# Patient Record
Sex: Female | Born: 1960
Health system: Southern US, Community
[De-identification: ages and names within clinical notes are randomized; demographics above are authoritative.]

## PROBLEM LIST (undated history)

## (undated) DIAGNOSIS — I10 Essential (primary) hypertension: Secondary | ICD-10-CM

## (undated) DIAGNOSIS — E669 Obesity, unspecified: Secondary | ICD-10-CM

## (undated) DIAGNOSIS — I480 Paroxysmal atrial fibrillation: Secondary | ICD-10-CM

## (undated) DIAGNOSIS — K219 Gastro-esophageal reflux disease without esophagitis: Secondary | ICD-10-CM

## (undated) DIAGNOSIS — I493 Ventricular premature depolarization: Secondary | ICD-10-CM

## (undated) DIAGNOSIS — S52501A Unspecified fracture of the lower end of right radius, initial encounter for closed fracture: Secondary | ICD-10-CM

## (undated) DIAGNOSIS — Z9289 Personal history of other medical treatment: Secondary | ICD-10-CM

## (undated) DIAGNOSIS — M1990 Unspecified osteoarthritis, unspecified site: Secondary | ICD-10-CM

## (undated) HISTORY — PX: BREAST EXCISIONAL BIOPSY: SUR124

## (undated) HISTORY — DX: Ventricular premature depolarization: I49.3

## (undated) HISTORY — DX: Gastro-esophageal reflux disease without esophagitis: K21.9

## (undated) HISTORY — DX: Paroxysmal atrial fibrillation: I48.0

## (undated) HISTORY — PX: ABDOMINAL HYSTERECTOMY: SHX81

## (undated) HISTORY — DX: Obesity, unspecified: E66.9

## (undated) HISTORY — DX: Personal history of other medical treatment: Z92.89

## (undated) HISTORY — PX: PATELLA FRACTURE SURGERY: SHX735

## (undated) HISTORY — DX: Essential (primary) hypertension: I10

---

## 1998-04-16 HISTORY — PX: BREAST LUMPECTOMY: SHX2

## 1998-04-19 ENCOUNTER — Ambulatory Visit (HOSPITAL_COMMUNITY): Admission: RE | Admit: 1998-04-19 | Discharge: 1998-04-19 | Payer: Self-pay | Admitting: Obstetrics & Gynecology

## 1998-04-27 ENCOUNTER — Ambulatory Visit (HOSPITAL_COMMUNITY): Admission: RE | Admit: 1998-04-27 | Discharge: 1998-04-27 | Payer: Self-pay | Admitting: Obstetrics & Gynecology

## 1998-05-14 ENCOUNTER — Ambulatory Visit (HOSPITAL_COMMUNITY): Admission: RE | Admit: 1998-05-14 | Discharge: 1998-05-14 | Payer: Self-pay | Admitting: *Deleted

## 1998-07-17 HISTORY — PX: TONSILLECTOMY: SUR1361

## 1998-09-15 ENCOUNTER — Inpatient Hospital Stay (HOSPITAL_COMMUNITY): Admission: EM | Admit: 1998-09-15 | Discharge: 1998-09-18 | Payer: Self-pay | Admitting: *Deleted

## 1999-02-28 ENCOUNTER — Other Ambulatory Visit: Admission: RE | Admit: 1999-02-28 | Discharge: 1999-02-28 | Payer: Self-pay | Admitting: Obstetrics & Gynecology

## 1999-09-13 ENCOUNTER — Encounter: Payer: Self-pay | Admitting: Obstetrics & Gynecology

## 1999-09-13 ENCOUNTER — Ambulatory Visit (HOSPITAL_COMMUNITY): Admission: RE | Admit: 1999-09-13 | Discharge: 1999-09-13 | Payer: Self-pay | Admitting: Obstetrics & Gynecology

## 2000-04-11 ENCOUNTER — Other Ambulatory Visit: Admission: RE | Admit: 2000-04-11 | Discharge: 2000-04-11 | Payer: Self-pay | Admitting: Obstetrics & Gynecology

## 2001-03-13 ENCOUNTER — Encounter: Admission: RE | Admit: 2001-03-13 | Discharge: 2001-03-13 | Payer: Self-pay | Admitting: Obstetrics & Gynecology

## 2001-03-13 ENCOUNTER — Encounter: Payer: Self-pay | Admitting: Obstetrics & Gynecology

## 2001-04-15 ENCOUNTER — Other Ambulatory Visit: Admission: RE | Admit: 2001-04-15 | Discharge: 2001-04-15 | Payer: Self-pay | Admitting: Obstetrics & Gynecology

## 2001-12-09 ENCOUNTER — Encounter: Payer: Self-pay | Admitting: Urology

## 2001-12-09 ENCOUNTER — Ambulatory Visit (HOSPITAL_COMMUNITY): Admission: RE | Admit: 2001-12-09 | Discharge: 2001-12-09 | Payer: Self-pay | Admitting: Urology

## 2002-06-10 ENCOUNTER — Other Ambulatory Visit: Admission: RE | Admit: 2002-06-10 | Discharge: 2002-06-10 | Payer: Self-pay | Admitting: Obstetrics & Gynecology

## 2003-03-27 ENCOUNTER — Ambulatory Visit (HOSPITAL_COMMUNITY): Admission: RE | Admit: 2003-03-27 | Discharge: 2003-03-27 | Payer: Self-pay | Admitting: Internal Medicine

## 2003-03-27 ENCOUNTER — Encounter: Payer: Self-pay | Admitting: Internal Medicine

## 2003-06-29 ENCOUNTER — Other Ambulatory Visit: Admission: RE | Admit: 2003-06-29 | Discharge: 2003-06-29 | Payer: Self-pay | Admitting: Obstetrics & Gynecology

## 2003-07-03 ENCOUNTER — Encounter: Admission: RE | Admit: 2003-07-03 | Discharge: 2003-07-03 | Payer: Self-pay | Admitting: Obstetrics & Gynecology

## 2004-04-11 ENCOUNTER — Ambulatory Visit (HOSPITAL_COMMUNITY): Admission: RE | Admit: 2004-04-11 | Discharge: 2004-04-11 | Payer: Self-pay | Admitting: Family Medicine

## 2004-05-02 ENCOUNTER — Ambulatory Visit (HOSPITAL_COMMUNITY): Admission: RE | Admit: 2004-05-02 | Discharge: 2004-05-02 | Payer: Self-pay | Admitting: Family Medicine

## 2004-08-12 ENCOUNTER — Other Ambulatory Visit: Admission: RE | Admit: 2004-08-12 | Discharge: 2004-08-12 | Payer: Self-pay | Admitting: Obstetrics & Gynecology

## 2005-06-03 ENCOUNTER — Ambulatory Visit (HOSPITAL_COMMUNITY): Admission: RE | Admit: 2005-06-03 | Discharge: 2005-06-03 | Payer: Self-pay | Admitting: Family Medicine

## 2005-11-08 ENCOUNTER — Ambulatory Visit (HOSPITAL_COMMUNITY): Admission: RE | Admit: 2005-11-08 | Discharge: 2005-11-08 | Payer: Self-pay | Admitting: Family Medicine

## 2005-11-13 ENCOUNTER — Ambulatory Visit (HOSPITAL_COMMUNITY): Admission: RE | Admit: 2005-11-13 | Discharge: 2005-11-14 | Payer: Self-pay | Admitting: Family Medicine

## 2005-11-15 ENCOUNTER — Encounter: Payer: Self-pay | Admitting: Obstetrics & Gynecology

## 2005-12-14 ENCOUNTER — Ambulatory Visit (HOSPITAL_COMMUNITY): Admission: RE | Admit: 2005-12-14 | Discharge: 2005-12-14 | Payer: Self-pay | Admitting: Cardiology

## 2007-03-22 ENCOUNTER — Ambulatory Visit (HOSPITAL_COMMUNITY): Admission: RE | Admit: 2007-03-22 | Discharge: 2007-03-22 | Payer: Self-pay | Admitting: Family Medicine

## 2007-04-01 ENCOUNTER — Encounter (HOSPITAL_COMMUNITY): Admission: RE | Admit: 2007-04-01 | Discharge: 2007-04-16 | Payer: Self-pay | Admitting: Family Medicine

## 2007-10-04 ENCOUNTER — Ambulatory Visit (HOSPITAL_COMMUNITY): Admission: RE | Admit: 2007-10-04 | Discharge: 2007-10-04 | Payer: Self-pay | Admitting: Obstetrics & Gynecology

## 2008-06-23 ENCOUNTER — Encounter: Admission: RE | Admit: 2008-06-23 | Discharge: 2008-06-23 | Payer: Self-pay | Admitting: Orthopedic Surgery

## 2008-06-29 DIAGNOSIS — Z9289 Personal history of other medical treatment: Secondary | ICD-10-CM

## 2008-06-29 HISTORY — DX: Personal history of other medical treatment: Z92.89

## 2008-07-07 ENCOUNTER — Ambulatory Visit (HOSPITAL_BASED_OUTPATIENT_CLINIC_OR_DEPARTMENT_OTHER): Admission: RE | Admit: 2008-07-07 | Discharge: 2008-07-08 | Payer: Self-pay | Admitting: Orthopedic Surgery

## 2008-11-02 ENCOUNTER — Ambulatory Visit (HOSPITAL_COMMUNITY): Admission: RE | Admit: 2008-11-02 | Discharge: 2008-11-02 | Payer: Self-pay | Admitting: Obstetrics & Gynecology

## 2009-09-10 ENCOUNTER — Encounter: Admission: RE | Admit: 2009-09-10 | Discharge: 2009-09-10 | Payer: Self-pay | Admitting: Obstetrics & Gynecology

## 2009-09-24 ENCOUNTER — Encounter: Admission: RE | Admit: 2009-09-24 | Discharge: 2009-09-24 | Payer: Self-pay | Admitting: Obstetrics & Gynecology

## 2009-10-21 ENCOUNTER — Ambulatory Visit (HOSPITAL_BASED_OUTPATIENT_CLINIC_OR_DEPARTMENT_OTHER): Admission: RE | Admit: 2009-10-21 | Discharge: 2009-10-21 | Payer: Self-pay | Admitting: General Surgery

## 2010-08-05 ENCOUNTER — Ambulatory Visit: Admission: RE | Admit: 2010-08-05 | Payer: Self-pay | Source: Home / Self Care | Admitting: Urology

## 2010-08-06 ENCOUNTER — Encounter: Payer: Self-pay | Admitting: Obstetrics & Gynecology

## 2010-08-07 ENCOUNTER — Encounter: Payer: Self-pay | Admitting: Obstetrics & Gynecology

## 2010-10-05 LAB — POCT HEMOGLOBIN-HEMACUE: Hemoglobin: 14.1 g/dL (ref 12.0–15.0)

## 2010-10-05 LAB — BASIC METABOLIC PANEL
BUN: 11 mg/dL (ref 6–23)
CO2: 28 mEq/L (ref 19–32)
Calcium: 9.2 mg/dL (ref 8.4–10.5)
Chloride: 103 mEq/L (ref 96–112)
Creatinine, Ser: 0.49 mg/dL (ref 0.4–1.2)
GFR calc Af Amer: >60 mL/min (ref >60)
GFR calc non Af Amer: >60 mL/min (ref >60)
Glucose, Bld: 82 mg/dL (ref 70–99)
Potassium: 4.1 mEq/L (ref 3.5–5.1)
Sodium: 137 mEq/L (ref 135–145)
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
## 2011-02-06 ENCOUNTER — Ambulatory Visit (HOSPITAL_COMMUNITY)
Admission: RE | Admit: 2011-02-06 | Discharge: 2011-02-06 | Disposition: A | Discharge status: Home / Self Care | Payer: BC Managed Care – PPO | Source: Ambulatory Visit | Attending: Physical Medicine and Rehabilitation | Admitting: Physical Medicine and Rehabilitation

## 2011-02-06 ENCOUNTER — Other Ambulatory Visit (HOSPITAL_COMMUNITY): Payer: Self-pay | Admitting: Physical Medicine and Rehabilitation

## 2011-02-06 DIAGNOSIS — M79609 Pain in unspecified limb: Secondary | ICD-10-CM

## 2011-02-06 DIAGNOSIS — G894 Chronic pain syndrome: Secondary | ICD-10-CM

## 2011-02-06 DIAGNOSIS — M47814 Spondylosis without myelopathy or radiculopathy, thoracic region: Secondary | ICD-10-CM

## 2011-02-06 DIAGNOSIS — M546 Pain in thoracic spine: Secondary | ICD-10-CM

## 2011-02-06 DIAGNOSIS — M542 Cervicalgia: Secondary | ICD-10-CM

## 2011-04-21 LAB — BASIC METABOLIC PANEL
BUN: 13 mg/dL (ref 6–23)
CO2: 27 mEq/L (ref 19–32)
Calcium: 9.4 mg/dL (ref 8.4–10.5)
Chloride: 102 mEq/L (ref 96–112)
Creatinine, Ser: 0.47 mg/dL (ref 0.4–1.2)
GFR calc Af Amer: >60 mL/min (ref >60)
GFR calc non Af Amer: >60 mL/min (ref >60)
Glucose, Bld: 120 mg/dL — ABNORMAL HIGH (ref 70–99)
Potassium: 3.5 mEq/L (ref 3.5–5.1)
Sodium: 137 mEq/L (ref 135–145)

## 2011-04-21 LAB — POCT HEMOGLOBIN-HEMACUE: Hemoglobin: 15.2 g/dL — ABNORMAL HIGH (ref 12.0–15.0)

## 2011-10-04 ENCOUNTER — Ambulatory Visit (HOSPITAL_COMMUNITY): Payer: BC Managed Care – PPO | Admitting: Physical Therapy

## 2011-10-10 ENCOUNTER — Ambulatory Visit (HOSPITAL_COMMUNITY)
Admission: RE | Admit: 2011-10-10 | Discharge: 2011-10-10 | Disposition: A | Discharge status: Home / Self Care | Payer: BC Managed Care – PPO | Source: Ambulatory Visit | Attending: Orthopedic Surgery | Admitting: Orthopedic Surgery

## 2011-10-10 DIAGNOSIS — M6281 Muscle weakness (generalized): Secondary | ICD-10-CM | POA: Insufficient documentation

## 2011-10-10 DIAGNOSIS — M25519 Pain in unspecified shoulder: Secondary | ICD-10-CM | POA: Insufficient documentation

## 2011-10-10 DIAGNOSIS — M7989 Other specified soft tissue disorders: Secondary | ICD-10-CM | POA: Insufficient documentation

## 2011-10-10 DIAGNOSIS — M546 Pain in thoracic spine: Secondary | ICD-10-CM | POA: Insufficient documentation

## 2011-10-10 DIAGNOSIS — IMO0001 Reserved for inherently not codable concepts without codable children: Secondary | ICD-10-CM | POA: Insufficient documentation

## 2011-10-10 NOTE — Evaluation (Signed)
Physical Therapy Evaluation  Patient Details  Name: Penny Byrd MRN: [id number] Date of Birth: 1961/06/18  Today's Date: 10/10/2011 Time: 1478-2956 Time Calculation (min): 58 min  Visit#: 1  of 12   Re-eval: 11/09/11 Assessment Diagnosis: cervical DDD Next MD Visit: 11/01/11 Prior Therapy: it has been years since she has had therapy  Past Medical History: No past medical history on file. Past Surgical History: No past surgical history on file.  Subjective Symptoms/Limitations Symptoms: Penny Byrd states that she has been having pain in her right shoulder blade area for years.   The patient states that the pain occasionally goes into her shoulder area but never down her arm. She denies neck pain.   She has had therpy before but it had been a long time ago, numerous cortisone shots, pain patches  without significant results.  Her MD would like her to have a MRI but the pt would like  to try therapy first.  Pt pain increases doing laundry, mopping and vacuuming.  The patient has increase pain with any lifting.  Lifting a jug of milk would incease her pain she states lifting a coffee cup would not.   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 Leisure: Hobbies-yes (Comment)  Cognition/Observation  The patient has increased swelling lateral to paraspinal mm along   approximate T4-T7 area.    Assessment Cervical AROM Cervical Flexion: wnl with not increase pain Cervical Extension:  (wnl) Cervical - Right Side Bend: wnl Cervical - Left Side Bend: wnl Cervical - Right Rotation: decrease 20 % Cervical - Left Rotation: wnl Cervical Strength Cervical Extension: 5/5 Cervical - Right Side Bend: 5/5 Cervical - Left Side Bend: 5/5 L UE: AROM:  WFL Strength:   WFL except ER which is 3+/5 B Palpation Palpation:  (Pt has significant tenderness to palpation area of swelling   Exercises:   Seated Retraction: 10 reps External Rotation: 10 reps Other Seated Exercises: shoulder shrugs up/back relax. Prone  Retraction: Both;10 reps Extension: Strengthening;Both;10 reps  Modalities Modalities: Cryotherapy;Electrical Stimulation Cryotherapy Number Minutes Cryotherapy: 10 Minutes Cryotherapy Location: Back Electrical Stimulation Electrical Stimulation Location: R thoracic aree lateral to T4-T8 Electrical Stimulation Action: IFES; Hi-Lo sweep  Electrical Stimulation Parameters: 16 Electrical Stimulation Goals: Edema;Pain  Physical Therapy Assessment and Plan PT Assessment and Plan Clinical Impression Statement: Pt with pain,swelling and mm weakness causing decreased ability to complete normal ADL's who will benefit from skilled physical therapy to return pt to being painfree Rehab Potential: Good Clinical Impairments Affecting Rehab Potential: pain, weakness PT Frequency: Min 3X/week PT Duration: 4 weeks PT Treatment/Interventions: Therapeutic activities;Therapeutic exercise;Patient/family education (modalities as needed for pain and edema control) PT Plan: Begin T-band for ER and ADDudcution of R shoulder as well as L SB    Goals Home Exercise Program Pt will Perform Home Exercise Program: Independently PT Short Term Goals Time to Complete Short Term Goals: 2 weeks PT Short Term Goal 1: Pt pain to be no greater than a 5 during ADL's including cleaning house. PT Long Term Goals Time to Complete Long Term Goals: 4 weeks PT Long Term Goal 1: Pt pain to be no greater than a 2 during housecleaning and lifting. PT Long Term Goal 2: Pt to be able to complete laundry with pain being at a 2 or below. Long  Term Goal 3: I in advance HEP Long Term Goal 4: Pt to be able to verbalize the importance of good posture in preventing back care.  Problem  List Patient Active Problem List  Diagnoses  . Swelling of skeletal muscle  . Pain in thoracic spine    PT - End of Session Activity Tolerance: Patient tolerated treatment well General Behavior During Session: [facility] for tasks performed Cognition: [facility] for tasks performed PT Plan of Care Consulted and Agree with Plan of Care: Patient    Joshaua Epple,CINDY 10/10/2011, 7:53 PM  Physician Documentation Your signature is required to indicate approval of the treatment plan as stated above.  Please sign and either send electronically or make a copy of this report for your files and return this physician signed original.   Please mark one 1.__approve of plan  2. ___approve of plan with the following conditions.   ______________________________                                                          _____________________ Physician Signature                                                                                                             Date

## 2011-10-10 NOTE — Patient Instructions (Addendum)
HEP given 

## 2011-10-11 ENCOUNTER — Ambulatory Visit (HOSPITAL_COMMUNITY)
Admission: RE | Admit: 2011-10-11 | Discharge: 2011-10-11 | Disposition: A | Discharge status: Home / Self Care | Payer: BC Managed Care – PPO | Source: Ambulatory Visit | Attending: Pediatrics | Admitting: Pediatrics

## 2011-10-11 DIAGNOSIS — M546 Pain in thoracic spine: Secondary | ICD-10-CM

## 2011-10-11 DIAGNOSIS — M7989 Other specified soft tissue disorders: Secondary | ICD-10-CM

## 2011-10-11 NOTE — Progress Notes (Signed)
Physical Therapy Treatment Patient Details  Name: Penny Byrd MRN: [id number] Date of Birth: 03/26/1961  Today's Date: 10/11/2011 Time: 2952-8413 Time Calculation (min): 43 min Visit#: 2  of 12   Re-eval: 11/09/11  Charge: therex 33 min IFES 10 min Ice 10 min  Subjective: Symptoms/Limitations Symptoms: Pt stated no pain at entrance, pt able to demonstrate HEP exercises correctly with no cueing required. Pain Assessment Currently in Pain?: No/denies  Objective:  Exercise/Treatments Seated Retraction: Limitations;10 reps Retraction Limitations: cervical and scapular retraction 10x 5" holds External Rotation: 10 reps Other Seated Exercises: shoulder shrugs up/back relax. Other Seated Exercises: cervical rotation and SB 10 x 10" each Prone  Retraction: Both;Limitations;15 reps Retraction Limitations: row Extension: 15 reps;Both Other Prone Exercises: wback 10 reps Standing External Rotation: Right;Theraband;15 reps Theraband Level (Shoulder External Rotation): Level 3 (Green) Other Standing Exercises: Right adduction 15x 5" green tband ROM / Strengthening / Isometric Strengthening UBE (Upper Arm Bike): 4' backwards  Modalities Modalities: Cryotherapy;Electrical Stimulation Cryotherapy Number Minutes Cryotherapy: 10 Minutes Cryotherapy Location: Back Type of Cryotherapy: Ice pack Pharmacologist Location: R thoracic aree lateral to T4-T8  Electrical Stimulation Action: IFES; Hi-Lo sweep  Electrical Stimulation Parameters: 15 Electrical Stimulation Goals: Edema  Physical Therapy Assessment and Plan PT Assessment and Plan Clinical Impression Statement: Able to add new exercises per PT POC following demonstration and min cueing for proper technique/form without difficulty. PT Plan: Continue with current POC.    Goals    Problem List Patient Active Problem List  Diagnoses  . Swelling of skeletal muscle  . Pain in thoracic spine     PT - End of Session Activity Tolerance: Patient tolerated treatment well General Behavior During Session: [facility] for tasks performed Cognition: [facility] for tasks performed  GP No functional reporting required  Juel Burrow, PTA 10/11/2011, 6:24 PM

## 2011-10-12 ENCOUNTER — Ambulatory Visit (HOSPITAL_COMMUNITY)
Admission: RE | Admit: 2011-10-12 | Discharge: 2011-10-12 | Disposition: A | Discharge status: Home / Self Care | Payer: BC Managed Care – PPO | Source: Ambulatory Visit | Attending: Pediatrics | Admitting: Pediatrics

## 2011-10-12 NOTE — Progress Notes (Signed)
Physical Therapy Treatment Patient Details  Name: Penny Byrd MRN: [id number] Date of Birth: [birth year]  Today's Date: 10/12/2011 Time: 4098-1191 Time Calculation (min): 55 min Visit#: 3  of 12   Re-eval: 11/09/11 Charges;  therex 30', IFES with ice X 15'    Subjective: Symptoms: Pt. states the exercises makes it feel better but then she hurts worse afterward when she fatigues.  States doing household duties (i.e.laundry) really gets it hurting.  Exercise/Treatments Seated Retraction: 15 reps Retraction Limitations: cervical and scapular retraction 10x 5" holds Prone  Retraction: 15 reps Retraction Limitations: row Extension: 15 reps;Both Other Prone Exercises: wback 15 reps Other Prone Exercises: alternating UE's 5 reps Standing External Rotation: Right;Theraband;15 reps Theraband Level (Shoulder External Rotation): Level 3 (Green) Other Standing Exercises: Tband Adduction 15X5" green Other Standing Exercises: postural 3 (retraction, rows, ext) 10 reps each green tband ROM / Strengthening / Isometric Strengthening UBE (Upper Arm Bike): 4' backwards     Modalities Modalities: Cryotherapy;Electrical Stimulation Cryotherapy Number Minutes Cryotherapy: 15 Minutes Cryotherapy Location:  (thoracic/ R scapular area) Museum/gallery exhibitions officer Stimulation Location: R thoracic/scapular area lateral to T4-T8 Electrical Stimulation Action: IFES hi lo sweep Intensity 12; Pt. laying in prone position with ice along with estim Electrical Stimulation Goals: Pain  Physical Therapy Assessment and Plan PT Assessment and Plan Clinical Impression Statement: Added alternating UE's in prone with fatigue at 5 reps; Attempted some manual/Soft tissue in the area of pain but pt. too hypersensitive at this time; e-stim/ice seems to be working well for pt.  Pt. requires manual cues to perform tband exercises correctly. PT Plan: Continue to progress scapular strength/stab; Add Ball  up/down, R/L and circles with elbow extended on wall next visit.     Problem List Patient Active Problem List  Diagnoses  . Swelling of skeletal muscle  . Pain in thoracic spine    PT - End of Session Activity Tolerance: Patient tolerated treatment well General Behavior During Session: [facility] for tasks performed Cognition: [facility] for tasks performed   Balian Schaller B. Bascom Levels, PTA 10/12/2011, 3:50 PM

## 2011-10-17 ENCOUNTER — Ambulatory Visit (HOSPITAL_COMMUNITY)
Admission: RE | Admit: 2011-10-17 | Discharge: 2011-10-17 | Disposition: A | Discharge status: Home / Self Care | Payer: BC Managed Care – PPO | Source: Ambulatory Visit | Attending: Pediatrics | Admitting: Pediatrics

## 2011-10-17 DIAGNOSIS — M546 Pain in thoracic spine: Secondary | ICD-10-CM

## 2011-10-17 DIAGNOSIS — IMO0001 Reserved for inherently not codable concepts without codable children: Secondary | ICD-10-CM | POA: Insufficient documentation

## 2011-10-17 DIAGNOSIS — M7989 Other specified soft tissue disorders: Secondary | ICD-10-CM

## 2011-10-17 DIAGNOSIS — M6281 Muscle weakness (generalized): Secondary | ICD-10-CM | POA: Insufficient documentation

## 2011-10-17 DIAGNOSIS — M25519 Pain in unspecified shoulder: Secondary | ICD-10-CM | POA: Insufficient documentation

## 2011-10-17 NOTE — Progress Notes (Signed)
Physical Therapy Treatment Patient Details  Name: Penny Byrd MRN: [id number] Date of Birth: 05-31-1961  Today's Date: 10/17/2011 Time: 4098-1191 Time Calculation (min): 50 min Visit#: 4  of 12   Re-eval: 11/09/11  Charge: therex 30 min IFES/ice 10 min  Subjective: Symptoms/Limitations Symptoms: Pt states no real pain to speak of worse pain is following housecleaning activities, it doesnt hurt during but the swelling/pain comes following.   Pain Assessment Currently in Pain?: No/denies  Objective:   Exercise/Treatments  Standing External Rotation: Right;Theraband;15 reps Theraband Level (Shoulder External Rotation): Level 3 (Green) Other Standing Exercises: Tband Adduction 15X5" green; postural 3 (retraction, rows, ext) 15 reps each green tband Other Standing Exercises: green ball A/P, R/L, CW/ CCW x 1 min each UE ROM / Strengthening / Isometric Strengthening UBE (Upper Arm Bike): 4' backwards Over Head Lace: 2' 34 lacing to eye level and back up again    Physical Therapy Assessment and Plan PT Assessment and Plan Clinical Impression Statement: Began new exercises for UE endurance with min cueing to relax UT.  Pt concerned with insurance payments and has cancelled PT sessions the rest of the week, pt will return to MD on 10/25/2011 reviewed goals secondary, pt did report she will be back to PT session prior MD apt next session.   PT Plan: Re-eval next session prior MD apt.    Goals Home Exercise Program PT Goal: Perform Home Exercise Program - Progress: Met PT Short Term Goals PT Short Term Goal 1 - Progress: Not met PT Long Term Goals PT Long Term Goal 1 - Progress: Not met PT Long Term Goal 2 - Progress: Not met Long Term Goal 4 Progress: Progressing toward goal  Problem List Patient Active Problem List  Diagnoses  . Swelling of skeletal muscle  . Pain in thoracic spine    PT - End of Session Activity Tolerance: Patient tolerated treatment  well General Behavior During Session: [facility] for tasks performed Cognition: [facility] for tasks performed  GP No functional reporting required  Juel Burrow, PTA 10/17/2011, 6:17 PM

## 2011-10-19 ENCOUNTER — Ambulatory Visit (HOSPITAL_COMMUNITY): Payer: BC Managed Care – PPO

## 2011-10-23 ENCOUNTER — Ambulatory Visit (HOSPITAL_COMMUNITY)
Admission: RE | Admit: 2011-10-23 | Discharge: 2011-10-23 | Disposition: A | Discharge status: Home / Self Care | Payer: BC Managed Care – PPO | Source: Ambulatory Visit | Attending: Pediatrics | Admitting: Pediatrics

## 2011-10-23 ENCOUNTER — Ambulatory Visit (HOSPITAL_COMMUNITY): Payer: BC Managed Care – PPO | Admitting: Physical Therapy

## 2011-10-23 NOTE — Evaluation (Signed)
Physical Therapy RE-Evaluation  Patient Details  Name: Penny Byrd MRN: [id number] Date of Birth: [birth year]  Today's Date: 10/23/2011 Time: 0802-0847 Time Calculation (min): 45 min  Visit#: 5  of 5   Re-eval: 11/09/11 Assessment Diagnosis: cervical DDD Next MD Visit: 11/01/11 Prior Therapy: it has been years since she has had therapy  Past Medical History: No past medical history on file. Past Surgical History: No past surgical history on file.  Subjective Symptoms/Limitations Symptoms:  [name] states that the last time she left her it was awful.. She states that she has been doing her exercises at home. How long can you sit comfortably?: [name] states that she has not noticed that her housework is any easier. How long can you stand comfortably?: Lifting is still a problem. 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 Leisure: Hobbies-yes (Comment)  Assessment RLE AROM (degrees) Overall AROM Right Lower Extremity: Within functional limits for tasks assessed RLE Strength RLE Overall Strength: Within Functional Limits for tasks assessed except for ER which was 3+/5 B at evaluation; now 4- Cervical AROM Cervical Flexion: wnl with not increase pain Cervical Extension:  (wnl) Cervical - Right Side Bend: wnl Cervical - Left Side Bend: wnl Cervical - Right Rotation: wnl was decreased 20% Cervical - Left Rotation: wnl Cervical Strength Cervical Extension: 5/5 Cervical - Right Side Bend: 5/5 Cervical - Left Side Bend: 5/5 Palpation Palpation: Pt is still not as tender to palpation as she was at evaluation time.  Exercise/Treatments   Standing Other Standing Exercises: Tband ER/ADD and Posstural 3 x 10   ROM / Strengthening / Isometric Strengthening UBE (Upper Arm Bike): 4' backward    Prone ex:  Prone ext  with 1# wt; prone scapular retraction with 3# weight x 10 each.   Physical Therapy Assessment and Plan PT Assessment and Plan Clinical Impression Statement: Pt is concerned about insurance coverage and would like to continue to work on own at home.  Pt has shown some improvement but continues to have limitations.  Recommend HEP for two weeks if pt does not improve she may want to return to thearapy for  manual techniques or modalities.   Rehab Potential: Good PT Plan: discharge to HEP.    Goals Home Exercise Program Pt will Perform Home Exercise Program: Independently PT Short Term Goals PT Short Term Goal 1: [name] states that the highest her pain has been after cleaning her house has been a 6 was an 8/10. PT Short Term Goal 1 - Progress: Progressing toward goal PT Long Term Goals PT Long Term Goal 1 - Progress: Not met PT Long Term Goal 2: Pt does laundry with other housework so it is difficult to differentiate housework from laundry. Long Term Goal 3 Progress: Met Long Term Goal 4 Progress: Progressing toward goal  Problem List Patient Active Problem List  Diagnoses  . Swelling of skeletal muscle  . Pain in thoracic spine    PT - End of Session Activity Tolerance: Patient tolerated treatment well General Behavior During Session: [facility] for tasks performed Cognition: [facility] for tasks performed PT Plan of Care PT Home Exercise Plan: t-band and prone stabilization exercises were given to the patient to work on at home.    Arieliz Latino,CINDY 10/23/2011, 9:49 AM  Physician Documentation Your signature is required to indicate approval of the treatment plan as stated above.  Please sign and either send electronically or make a copy of this report for your files and return this physician signed original.   Please mark one 1.__approve of plan  2. ___approve of plan with the following conditions.   ______________________________                                                           _____________________ Physician Signature                                                                                                             Date

## 2011-10-25 ENCOUNTER — Ambulatory Visit (HOSPITAL_COMMUNITY): Payer: BC Managed Care – PPO | Admitting: Physical Therapy

## 2011-10-27 ENCOUNTER — Ambulatory Visit (HOSPITAL_COMMUNITY): Payer: BC Managed Care – PPO

## 2012-08-28 ENCOUNTER — Telehealth: Payer: Self-pay

## 2012-08-28 NOTE — Telephone Encounter (Signed)
Pt was referred by Dr. Dwana Melena for screening colonoscopy. LMOM to call.

## 2012-09-03 NOTE — Telephone Encounter (Signed)
LMOM to call.

## 2012-09-06 ENCOUNTER — Other Ambulatory Visit (HOSPITAL_COMMUNITY): Payer: Self-pay | Admitting: Internal Medicine

## 2012-09-06 ENCOUNTER — Ambulatory Visit (HOSPITAL_COMMUNITY)
Admission: RE | Admit: 2012-09-06 | Discharge: 2012-09-06 | Disposition: A | Discharge status: Home / Self Care | Payer: BC Managed Care – PPO | Source: Ambulatory Visit | Attending: Internal Medicine | Admitting: Internal Medicine

## 2012-09-06 DIAGNOSIS — N201 Calculus of ureter: Secondary | ICD-10-CM | POA: Insufficient documentation

## 2012-09-06 DIAGNOSIS — R1032 Left lower quadrant pain: Secondary | ICD-10-CM | POA: Insufficient documentation

## 2012-09-06 DIAGNOSIS — R3129 Other microscopic hematuria: Secondary | ICD-10-CM | POA: Insufficient documentation

## 2012-09-06 DIAGNOSIS — N200 Calculus of kidney: Secondary | ICD-10-CM

## 2012-09-10 NOTE — Telephone Encounter (Signed)
Letter to PCP

## 2012-12-14 ENCOUNTER — Encounter: Payer: Self-pay | Admitting: *Deleted

## 2012-12-23 ENCOUNTER — Encounter: Payer: Self-pay | Admitting: Cardiovascular Disease

## 2013-01-28 ENCOUNTER — Ambulatory Visit: Payer: BC Managed Care – PPO | Admitting: Cardiovascular Disease

## 2013-01-30 ENCOUNTER — Ambulatory Visit (INDEPENDENT_AMBULATORY_CARE_PROVIDER_SITE_OTHER): Payer: BC Managed Care – PPO | Admitting: Cardiovascular Disease

## 2013-01-30 ENCOUNTER — Encounter: Payer: Self-pay | Admitting: Cardiovascular Disease

## 2013-01-30 VITALS — BP 126/82 | HR 62 | Ht 61.0 in | Wt 179.1 lb

## 2013-01-30 DIAGNOSIS — E785 Hyperlipidemia, unspecified: Secondary | ICD-10-CM

## 2013-01-30 DIAGNOSIS — Q2112 Patent foramen ovale: Secondary | ICD-10-CM

## 2013-01-30 DIAGNOSIS — E782 Mixed hyperlipidemia: Secondary | ICD-10-CM

## 2013-01-30 DIAGNOSIS — I10 Essential (primary) hypertension: Secondary | ICD-10-CM

## 2013-01-30 DIAGNOSIS — I4891 Unspecified atrial fibrillation: Secondary | ICD-10-CM

## 2013-01-30 DIAGNOSIS — R002 Palpitations: Secondary | ICD-10-CM

## 2013-01-30 DIAGNOSIS — E66811 Obesity, class 1: Secondary | ICD-10-CM

## 2013-01-30 DIAGNOSIS — E669 Obesity, unspecified: Secondary | ICD-10-CM

## 2013-01-30 DIAGNOSIS — Q211 Atrial septal defect: Secondary | ICD-10-CM

## 2013-01-30 DIAGNOSIS — Q2111 Secundum atrial septal defect: Secondary | ICD-10-CM

## 2013-01-30 DIAGNOSIS — I480 Paroxysmal atrial fibrillation: Secondary | ICD-10-CM

## 2013-01-30 NOTE — Patient Instructions (Addendum)
Your physician recommends that you return for lab work fasting.  Your physician recommends that you schedule a follow-up appointment in 3 MONTHS.

## 2013-01-31 LAB — COMPREHENSIVE METABOLIC PANEL
ALT: 24 U/L (ref 0–35)
CO2: 27 mEq/L (ref 19–32)
Calcium: 9.6 mg/dL (ref 8.4–10.5)
Chloride: 106 mEq/L (ref 96–112)
Creat: 0.58 mg/dL (ref 0.50–1.10)
Glucose, Bld: 110 mg/dL — ABNORMAL HIGH (ref 70–99)
Sodium: 140 mEq/L (ref 135–145)
Total Protein: 6.7 g/dL (ref 6.0–8.3)

## 2013-01-31 LAB — CBC
Platelets: 353 10*3/uL (ref 150–400)
RBC: 4.55 MIL/uL (ref 3.87–5.11)
RDW: 13.8 % (ref 11.5–15.5)
WBC: 8.6 10*3/uL (ref 4.0–10.5)

## 2013-02-04 ENCOUNTER — Encounter: Payer: Self-pay | Admitting: Cardiovascular Disease

## 2013-02-04 ENCOUNTER — Encounter: Payer: Self-pay | Admitting: *Deleted

## 2013-02-04 DIAGNOSIS — E785 Hyperlipidemia, unspecified: Secondary | ICD-10-CM | POA: Insufficient documentation

## 2013-02-04 DIAGNOSIS — Q211 Atrial septal defect: Secondary | ICD-10-CM | POA: Insufficient documentation

## 2013-02-04 DIAGNOSIS — Q2112 Patent foramen ovale: Secondary | ICD-10-CM | POA: Insufficient documentation

## 2013-02-04 DIAGNOSIS — I10 Essential (primary) hypertension: Secondary | ICD-10-CM | POA: Insufficient documentation

## 2013-02-04 DIAGNOSIS — R002 Palpitations: Secondary | ICD-10-CM | POA: Insufficient documentation

## 2013-02-04 DIAGNOSIS — I480 Paroxysmal atrial fibrillation: Secondary | ICD-10-CM | POA: Insufficient documentation

## 2013-02-04 DIAGNOSIS — E669 Obesity, unspecified: Secondary | ICD-10-CM | POA: Insufficient documentation

## 2013-02-04 LAB — NMR LIPOPROFILE WITH LIPIDS
Cholesterol, Total: 179 mg/dL (ref <200)
HDL Particle Number: 27 umol/L — ABNORMAL LOW (ref >=30.5)
LDL (calc): 120 mg/dL — ABNORMAL HIGH (ref <100)
LDL Size: 20.4 nm — ABNORMAL LOW (ref >20.5)
LP-IR Score: 75 — ABNORMAL HIGH (ref <=45)
Large HDL-P: 1.5 umol/L — ABNORMAL LOW (ref >=4.8)
Small LDL Particle Number: 883 nmol/L — ABNORMAL HIGH (ref <=527)

## 2013-02-04 NOTE — Progress Notes (Signed)
Patient ID: Penny Byrd, female   DOB: 1960-08-05, 52 y.o.   MRN: [id number]     HPI: Penny Byrd, is a 52 y.o. female who presents for a 1 year cardiology evaluation. Penny Byrd has a history of paroxysmal atrial fibrillation. She has a documented very small unidirectional atrial septal defect/PFO. Additional problems have also included hypertension, palpitations, as well as obesity. In addition, she has been documented to have low HDL levels. The past year, she states her blood pressure has been controlled on her metoprolol and valsartan therapy. She did experience one episode of kidney stones. She has not been successful with weight loss. She does not routinely exercise. She is unaware of any recurrent atrial fibrillation. She presents for evaluation.    Past Medical History  Diagnosis Date  . Paroxysmal a-fib   . Hypertension   . Palpitation   . Morbid obesity   . H/O echocardiogram 09/16/2010    History of small atrioseptal defect/PFO that is unidirectional,(Right to Left)  . History of stress test 06/29/2008    Normal Myocardial perfusion imaging, No significant ischemia demonstrated this is a low risk scan, No prior study availiable for comparison  . Sleep apnea 01/23/2011    AHI during sleep (4h ) was 0.0/hr during REMM sleep 0.0/hr, RDI during sleep (4h49min) was 12.0/hr during REM sleep 1.7/hr  . Diabetes     Past Surgical History  Procedure Laterality Date  . Abdominal hysterectomy  age 28  . Breast lumpectomy Right 04/1998    Dr Precious Reel, Benign Right breast disease  . Tonsillectomy  2000    Not on File  Current Outpatient Prescriptions  Medication Sig Dispense Refill  . aspirin 325 MG tablet Take 325 mg by mouth daily.      . metoprolol succinate (TOPROL-XL) 50 MG 24 hr tablet Take 50 mg by mouth daily. Take with or immediately following a meal.      . omeprazole (PRILOSEC) 20 MG capsule Take 20 mg by mouth daily.      . valsartan (DIOVAN) 80 MG  tablet Take 80 mg by mouth daily.       No current facility-administered medications for this visit.    Socially she is married. There is no tobacco use. She does not routinely exercise.  ROS is negative for fevers, chills or night sweats. She is unaware of any recent palpitations on metoprolol. She denies chest pressure. She denies paresthesias. She denies wheezing. She does admit to some fatigue the she denies abdominal pain. She denies nausea vomiting or diarrhea. She denies change in bowel or bladder habits.  Other system review is negative.  PE BP 126/82  Pulse 62  Ht 5\' 1"  (1.549 m)  Wt 179 lb 1.6 oz (81.239 kg)  BMI 33.86 kg/m2  General: Alert, oriented, no distress.  Skin: normal turgor, no rashes HEENT: Normocephalic, atraumatic. Pupils round and reactive; sclera anicteric;no lid lag.  Nose without nasal septal hypertrophy Mouth/Parynx benign; Mallinpatti scale 3 Neck: No JVD, no carotid briuts Lungs: clear to ausculatation and percussion; no wheezing or rales Heart: RRR, s1 s2 normal Lungs 6 systolic murmur, unchanged.  Abdomen: soft, nontender; no hepatosplenomehaly, BS+; abdominal aorta nontender and not dilated by palpation. Pulses 2+ Extremities: no clubbing cyanosis or edema, Homan's sign negative  Neurologic: grossly nonfocal  ECG: Sinus rhythm at 62 beats per minute. PR interval 170 ms. QTc interval 410 ms  LABS:  BMET    Component Value Date/Time   NA 140 01/31/2013  0755   K 4.7 01/31/2013 0755   CL 106 01/31/2013 0755   CO2 27 01/31/2013 0755   GLUCOSE 110* 01/31/2013 0755   BUN 16 01/31/2013 0755   CREATININE 0.58 01/31/2013 0755   CREATININE 0.49 10/18/2009 1204   CALCIUM 9.6 01/31/2013 0755   GFRNONAA >60 10/18/2009 1204   GFRAA  Value: >60        The eGFR has been calculated using the MDRD equation. This calculation has not been validated in all clinical situations. eGFR's persistently <60 mL/min signify possible Chronic Kidney Disease. 10/18/2009 1204      Hepatic Function Panel     Component Value Date/Time   PROT 6.7 01/31/2013 0755   ALBUMIN 4.0 01/31/2013 0755   AST 19 01/31/2013 0755   ALT 24 01/31/2013 0755   ALKPHOS 80 01/31/2013 0755   BILITOT 0.3 01/31/2013 0755     CBC    Component Value Date/Time   WBC 8.6 01/31/2013 0755   RBC 4.55 01/31/2013 0755   HGB 13.9 01/31/2013 0755   HCT 39.6 01/31/2013 0755   PLT 353 01/31/2013 0755   MCV 87.0 01/31/2013 0755   MCH 30.5 01/31/2013 0755   MCHC 35.1 01/31/2013 0755   RDW 13.8 01/31/2013 0755     BNP No results found for this basename: probnp    Lipid Panel     Component Value Date/Time   TRIG 116 01/31/2013 0755   LDLCALC 120* 01/31/2013 0755     RADIOLOGY: No results found.    ASSESSMENT AND PLAN: Penny Byrd is a 52 year old female who has a history of hypertension, as well as remote history of paroxysmal atrial fibrillation. Her blood pressure is well controlled. She is unaware of any breakthrough palpitations or atrial fibrillation on current therapy. She is had low HDL levels in the past. I'm scheduling her for an MMR vital signs profile since I am suspect that she may very well have increased LDL particle number. We did discuss importance of exercise and weight loss the body mass index is 33.86. I will notify her regarding her laboratory and he will be adjusted accordingly. Of significant increased LDL particle number initiation of lipid therapy will be recommended.      Lennette Bihari, MD, [facility]  02/04/2013 9:03 AM

## 2013-02-10 ENCOUNTER — Other Ambulatory Visit: Payer: Self-pay | Admitting: *Deleted

## 2013-02-10 MED ORDER — ATORVASTATIN CALCIUM 20 MG PO TABS: 20.0000 mg | ORAL_TABLET | Freq: Every day | ORAL | Status: DC

## 2013-03-10 ENCOUNTER — Other Ambulatory Visit: Payer: Self-pay | Admitting: Cardiovascular Disease

## 2013-04-23 ENCOUNTER — Other Ambulatory Visit: Payer: Self-pay | Admitting: *Deleted

## 2013-04-23 MED ORDER — METOPROLOL SUCCINATE ER 50 MG PO TB24: 50.0000 mg | ORAL_TABLET | Freq: Every day | ORAL | Status: DC

## 2013-04-23 MED ORDER — DIOVAN 80 MG PO TABS: ORAL_TABLET | ORAL | Status: DC

## 2013-04-23 NOTE — Telephone Encounter (Signed)
Rx was sent to pharmacy electronically. 

## 2013-04-24 ENCOUNTER — Telehealth: Payer: Self-pay | Admitting: Cardiovascular Disease

## 2013-04-24 ENCOUNTER — Other Ambulatory Visit: Payer: Self-pay | Admitting: *Deleted

## 2013-04-24 NOTE — Telephone Encounter (Signed)
Patient is returning a call from [place] about her medications.

## 2013-04-24 NOTE — Telephone Encounter (Signed)
Forward to [employer]

## 2013-04-28 NOTE — Telephone Encounter (Signed)
LMTCB

## 2013-04-29 ENCOUNTER — Telehealth: Payer: Self-pay | Admitting: Cardiovascular Disease

## 2013-04-29 ENCOUNTER — Other Ambulatory Visit: Payer: Self-pay | Admitting: *Deleted

## 2013-04-29 NOTE — Telephone Encounter (Signed)
Called pharmacy Mardelle Matte) 04/29/13 AM to clarify.

## 2013-04-29 NOTE — Telephone Encounter (Signed)
Message forwarded to J. Elkins, RN.  

## 2013-04-29 NOTE — Telephone Encounter (Signed)
Please call-question about E Script received yesterday for her Metoprolol.

## 2014-04-29 ENCOUNTER — Other Ambulatory Visit: Payer: Self-pay | Admitting: Cardiovascular Disease

## 2014-04-29 NOTE — Telephone Encounter (Signed)
Rx was sent to pharmacy electronically. 

## 2014-05-04 ENCOUNTER — Ambulatory Visit (INDEPENDENT_AMBULATORY_CARE_PROVIDER_SITE_OTHER): Payer: BC Managed Care – PPO | Admitting: Cardiology

## 2014-05-04 ENCOUNTER — Encounter: Payer: Self-pay | Admitting: Cardiology

## 2014-05-04 VITALS — BP 148/86 | HR 62 | Ht 61.0 in | Wt 178.2 lb

## 2014-05-04 DIAGNOSIS — Q211 Atrial septal defect: Secondary | ICD-10-CM

## 2014-05-04 DIAGNOSIS — Z23 Encounter for immunization: Secondary | ICD-10-CM

## 2014-05-04 DIAGNOSIS — Q2112 Patent foramen ovale: Secondary | ICD-10-CM

## 2014-05-04 DIAGNOSIS — E669 Obesity, unspecified: Secondary | ICD-10-CM

## 2014-05-04 DIAGNOSIS — Z79899 Other long term (current) drug therapy: Secondary | ICD-10-CM

## 2014-05-04 DIAGNOSIS — I10 Essential (primary) hypertension: Secondary | ICD-10-CM

## 2014-05-04 DIAGNOSIS — I480 Paroxysmal atrial fibrillation: Secondary | ICD-10-CM

## 2014-05-04 DIAGNOSIS — E785 Hyperlipidemia, unspecified: Secondary | ICD-10-CM

## 2014-05-04 MED ORDER — METOPROLOL SUCCINATE ER 50 MG PO TB24: 50.0000 mg | ORAL_TABLET | Freq: Every day | ORAL | Status: DC

## 2014-05-04 MED ORDER — VALSARTAN 80 MG PO TABS: 80.0000 mg | ORAL_TABLET | Freq: Every day | ORAL | Status: DC

## 2014-05-04 NOTE — Patient Instructions (Signed)
Your medications have been refilled.   Please have fasting labwork in 1-2 weeks - you can go to the [facility] in [place] or Dr. Scharlene GlossHall's office (just have results sent to Dr. Tresa EndoKelly @ fax [phone])  Your physician wants you to follow-up in: 1 year with Dr. Tresa EndoKelly. You will receive a reminder letter in the mail two months in advance. If you don't receive a letter, please call our office to schedule the follow-up appointment.

## 2014-05-04 NOTE — Assessment & Plan Note (Signed)
No recurrence. 

## 2014-05-04 NOTE — Assessment & Plan Note (Signed)
Known elevated small particle.  Will recheck cholesterol and place on zetia once results are back.

## 2014-05-04 NOTE — Progress Notes (Addendum)
05/04/2014   PCP: Catalina PizzaHALL, ZACH, MD   Chief Complaint  Patient presents with  . Follow-up    1 year follow-up, pt denied SOB and chest pain    Primary Cardiologist:Dr. Bishop Limbo. Kelly   HPI:  53  y.o. female who presents for a 1 year cardiology evaluation.  Ms. Lou MinerHuffman has a history of paroxysmal atrial fibrillation. She has a documented very small unidirectional atrial septal defect/PFO. Additional problems have also included hypertension, palpitations, as well as obesity. In addition, she has been documented to have low HDL levels. The past year, she states her blood pressure has been controlled on her metoprolol and valsartan therapy. She did experience one episode of kidney stones. She has not been successful with weight loss. She does not routinely exercise. She is unaware of any recurrent atrial fibrillation. She presents for evaluation.  She has had a negative sleep study.  No complaints as above, recent BP check with PCP was stable. She stated it is always elevated here. She would like to have a flu shot as well.  Has not had any recent labs. Previously she was to take statin and if not statin then zetia.  Will check lipids.  She has known elevated small particle.     No Known Allergies  Current Outpatient Prescriptions  Medication Sig Dispense Refill  . aspirin 325 MG tablet Take 325 mg by mouth daily.      Marland Kitchen. DIOVAN 80 MG tablet Take 1 tablet (80 mg total) by mouth daily. MUST KEEP APPOINTMENT 05/04/14 WITH Ihsan Nomura FOR FUTURE REFILLS.  30 tablet  0  . estradiol (ESTRACE) 1 MG tablet Take 1 tablet by mouth daily.      . metoprolol succinate (TOPROL-XL) 50 MG 24 hr tablet Take 1 tablet (50 mg total) by mouth daily. Take with or immediately following a meal.  30 tablet  11  . omeprazole (PRILOSEC) 20 MG capsule Take 20 mg by mouth daily.      . valsartan (DIOVAN) 80 MG tablet Take 1 tablet (80 mg total) by mouth daily.  30 tablet  11   No current  facility-administered medications for this visit.    Past Medical History  Diagnosis Date  . Paroxysmal a-fib   . Hypertension   . Palpitation   . Morbid obesity   . H/O echocardiogram 09/16/2010    History of small atrioseptal defect/PFO that is unidirectional,(Right to Left)  . History of stress test 06/29/2008    Normal Myocardial perfusion imaging, No significant ischemia demonstrated this is a low risk scan, No prior study availiable for comparison  . Sleep apnea 01/23/2011    AHI during sleep (4h 41min) was 0.0/hr during REMM sleep 0.0/hr, RDI during sleep (4h1641min) was 12.0/hr during REM sleep 1.7/hr  . Diabetes     Past Surgical History  Procedure Laterality Date  . Abdominal hysterectomy  age 53  . Breast lumpectomy Right 04/1998    Dr Precious ReelAnita Lindsey, Benign Right breast disease  . Tonsillectomy  2000    NGE:XBMWUXL:KGROS:General:no colds or fevers, no weight changes Skin:no rashes or ulcers HEENT:no blurred vision, no congestion CV:see HPI PUL:see HPI GI:no diarrhea constipation or melena, no indigestion GU:no hematuria, no dysuria MS:no joint pain, no claudication Neuro:no syncope, no lightheadedness Endo:no diabetes, no thyroid disease  Wt Readings from Last 3 Encounters:  05/04/14 178 lb 3.2 oz (80.831 kg)  01/30/13 179 lb 1.6 oz (81.239 kg)    PHYSICAL EXAM  BP 148/86  Pulse 62  Ht 5\' 1"  (1.549 m)  Wt 178 lb 3.2 oz (80.831 kg)  BMI 33.69 kg/m2 General:Pleasant affect, NAD Skin:Warm and dry, brisk capillary refill HEENT:normocephalic, sclera clear, mucus membranes moist Neck:supple, no JVD, no bruits  Heart:S1S2 RRR without murmur, gallup, rub or click Lungs:clear without rales, rhonchi, or wheezes WUJ:WJXBAbd:soft, non tender, + BS, do not palpate liver spleen or masses Ext:no lower ext edema, 2+ pedal pulses, 2+ radial pulses Neuro:alert and oriented, MAE, follows commands, + facial symmetry  EKG:SR  Normal EKG  ASSESSMENT AND PLAN Essential hypertension I  refilled current meds.  She will check BP at home and if it stays elevated will let us know.  Hyperlipemia Known elevated small particle.  Will recheck cholesterol and place on zetia once results are back.  PFO (patent foramen ovale) stable  PAF (paroxysmal atrial fibrillation) No recurrence.   Obesity (BMI 30.0-34.9) Discussed importance of exercise, even if 10 min a day, her work day does include walking.   Follow up in 1 year with Dr. Bishop Limbo. Kelly unless problems prior to that time.

## 2014-05-04 NOTE — Assessment & Plan Note (Signed)
I refilled current meds.  She will check BP at home and if it stays elevated will let us know.

## 2014-05-04 NOTE — Assessment & Plan Note (Signed)
Discussed importance of exercise, even if 10 min a day, her work day does include walking.

## 2014-05-04 NOTE — Assessment & Plan Note (Signed)
stable °

## 2014-05-12 ENCOUNTER — Encounter: Payer: Self-pay | Admitting: Cardiology

## 2014-05-13 LAB — LIPID PANEL
CHOLESTEROL: 171 mg/dL (ref 0–200)
HDL: 49 mg/dL (ref >39)
LDL Cholesterol: 103 mg/dL — ABNORMAL HIGH (ref 0–99)
Total CHOL/HDL Ratio: 3.5 Ratio
Triglycerides: 95 mg/dL (ref <150)
VLDL: 19 mg/dL (ref 0–40)

## 2014-05-13 LAB — COMPREHENSIVE METABOLIC PANEL
ALBUMIN: 3.7 g/dL (ref 3.5–5.2)
ALT: 14 U/L (ref 0–35)
AST: 16 U/L (ref 0–37)
Alkaline Phosphatase: 69 U/L (ref 39–117)
BUN: 19 mg/dL (ref 6–23)
CO2: 26 meq/L (ref 19–32)
CREATININE: 0.53 mg/dL (ref 0.50–1.10)
Calcium: 9 mg/dL (ref 8.4–10.5)
Chloride: 103 mEq/L (ref 96–112)
Glucose, Bld: 91 mg/dL (ref 70–99)
POTASSIUM: 4.3 meq/L (ref 3.5–5.3)
SODIUM: 138 meq/L (ref 135–145)
TOTAL PROTEIN: 6.7 g/dL (ref 6.0–8.3)
Total Bilirubin: 0.4 mg/dL (ref 0.2–1.2)

## 2014-05-22 ENCOUNTER — Telehealth: Payer: Self-pay | Admitting: *Deleted

## 2014-05-22 NOTE — Telephone Encounter (Signed)
-----   Message from Leone BrandLaura R Ingold, NP sent at 05/19/2014  8:40 AM EST ----- Labs improved, would like to add zetia 10 mg daily and recheck lipids and hepatic in 6 months- thanks.

## 2014-05-22 NOTE — Telephone Encounter (Signed)
Left message for patient to return a call to me. 

## 2014-08-04 ENCOUNTER — Other Ambulatory Visit: Payer: Self-pay | Admitting: Obstetrics and Gynecology

## 2014-08-05 LAB — CYTOLOGY - PAP

## 2015-05-05 ENCOUNTER — Ambulatory Visit: Payer: Self-pay | Admitting: Physician Assistant

## 2015-05-06 ENCOUNTER — Ambulatory Visit (INDEPENDENT_AMBULATORY_CARE_PROVIDER_SITE_OTHER): Payer: 59 | Admitting: Adult Health

## 2015-05-06 ENCOUNTER — Encounter: Payer: Self-pay | Admitting: Adult Health

## 2015-05-06 VITALS — BP 126/76 | HR 53 | Ht 61.0 in | Wt 176.6 lb

## 2015-05-06 DIAGNOSIS — R002 Palpitations: Secondary | ICD-10-CM

## 2015-05-06 DIAGNOSIS — E785 Hyperlipidemia, unspecified: Secondary | ICD-10-CM

## 2015-05-06 DIAGNOSIS — Z23 Encounter for immunization: Secondary | ICD-10-CM

## 2015-05-06 DIAGNOSIS — I480 Paroxysmal atrial fibrillation: Secondary | ICD-10-CM

## 2015-05-06 DIAGNOSIS — I10 Essential (primary) hypertension: Secondary | ICD-10-CM

## 2015-05-06 MED ORDER — METOPROLOL SUCCINATE ER 50 MG PO TB24: 50.0000 mg | ORAL_TABLET | ORAL | Status: DC

## 2015-05-06 MED ORDER — ASPIRIN EC 325 MG PO TBEC: 325.0000 mg | DELAYED_RELEASE_TABLET | Freq: Every day | ORAL | Status: DC

## 2015-05-06 NOTE — Progress Notes (Deleted)
Name: Penny Byrd    DOB: 12-09-1960  Age: 54 y.o.  MR#: [id number]       PCP:  Wende Neighbors, MD      Insurance: Payor: Onnie Boer / Plan: [employer] OTHER / Product Type: *No Product type* /   CC:   No chief complaint on file.   VS Filed Vitals:   05/06/15 1301  BP: 126/76  Pulse: 53  Height: _0  (1.549 m)  Weight: 176 lb 9.6 oz (80.105 kg)  SpO2: 99%    Weights Current Weight  05/06/15 176 lb 9.6 oz (80.105 kg)  05/04/14 178 lb 3.2 oz (80.831 kg)  01/30/13 179 lb 1.6 oz (81.239 kg)    Blood Pressure  BP Readings from Last 3 Encounters:  05/06/15 126/76  05/04/14 148/86  01/30/13 126/82     Admit date:  (Not on file) Last encounter with RMR:  Visit date not found   Allergy Review of patient's allergies indicates no known allergies.  Current Outpatient Prescriptions  Medication Sig Dispense Refill  . aspirin 325 MG tablet Take 325 mg by mouth daily.    Marland Kitchen estradiol (ESTRACE) 1 MG tablet Take 1 tablet by mouth daily.    . metoprolol succinate (TOPROL-XL) 50 MG 24 hr tablet Take 1 tablet (50 mg total) by mouth daily. Take with or immediately following a meal. 30 tablet 11  . omeprazole (PRILOSEC) 20 MG capsule Take 20 mg by mouth daily.    . valsartan (DIOVAN) 80 MG tablet Take 1 tablet (80 mg total) by mouth daily. 30 tablet 11   No current facility-administered medications for this visit.    Discontinued Meds:    Medications Discontinued During This Encounter  Medication Reason  . DIOVAN 80 MG tablet Error    Patient Active Problem List   Diagnosis Date Noted  . Obesity (BMI 30.0-34.9) 02/04/2013  . Hyperlipemia 02/04/2013  . Essential hypertension 02/04/2013  . PFO (patent foramen ovale) 02/04/2013  . Palpitations 02/04/2013  . PAF (paroxysmal atrial fibrillation) ([place]) 02/04/2013  . Swelling of skeletal muscle 10/10/2011  . Pain in thoracic spine 10/10/2011    LABS    Component Value Date/Time   NA 138 05/13/2014 0952   NA 140  01/31/2013 0755   NA 137 10/18/2009 1204   K 4.3 05/13/2014 0952   K 4.7 01/31/2013 0755   K 4.1 10/18/2009 1204   CL 103 05/13/2014 0952   CL 106 01/31/2013 0755   CL 103 10/18/2009 1204   CO2 26 05/13/2014 0952   CO2 27 01/31/2013 0755   CO2 28 10/18/2009 1204   GLUCOSE 91 05/13/2014 0952   GLUCOSE 110* 01/31/2013 0755   GLUCOSE 82 10/18/2009 1204   BUN 19 05/13/2014 0952   BUN 16 01/31/2013 0755   BUN 11 10/18/2009 1204   CREATININE 0.53 05/13/2014 0952   CREATININE 0.58 01/31/2013 0755   CREATININE 0.49 10/18/2009 1204   CREATININE 0.47 07/06/2008 1400   CALCIUM 9.0 05/13/2014 0952   CALCIUM 9.6 01/31/2013 0755   CALCIUM 9.2 10/18/2009 1204   GFRNONAA >60 10/18/2009 1204   GFRNONAA >60 07/06/2008 1400   GFRAA  10/18/2009 1204    >60        The eGFR has been calculated using the MDRD equation. This calculation has not been validated in all clinical situations. eGFR's persistently <60 mL/min signify possible Chronic Kidney Disease.   GFRAA  07/06/2008 1400    >60        The  eGFR has been calculated using the MDRD equation. This calculation has not been validated in all clinical   CMP     Component Value Date/Time   NA 138 05/13/2014 0952   K 4.3 05/13/2014 0952   CL 103 05/13/2014 0952   CO2 26 05/13/2014 0952   GLUCOSE 91 05/13/2014 0952   BUN 19 05/13/2014 0952   CREATININE 0.53 05/13/2014 0952   CREATININE 0.49 10/18/2009 1204   CALCIUM 9.0 05/13/2014 0952   PROT 6.7 05/13/2014 0952   ALBUMIN 3.7 05/13/2014 0952   AST 16 05/13/2014 0952   ALT 14 05/13/2014 0952   ALKPHOS 69 05/13/2014 0952   BILITOT 0.4 05/13/2014 0952   GFRNONAA >60 10/18/2009 1204   GFRAA  10/18/2009 1204    >60        The eGFR has been calculated using the MDRD equation. This calculation has not been validated in all clinical situations. eGFR's persistently <60 mL/min signify possible Chronic Kidney Disease.       Component Value Date/Time   WBC 8.6 01/31/2013 0755    HGB 13.9 01/31/2013 0755   HGB 14.1 10/21/2009 1309   HGB 15.2* 07/07/2008 1040   HCT 39.6 01/31/2013 0755   MCV 87.0 01/31/2013 0755    Lipid Panel     Component Value Date/Time   CHOL 171 05/13/2014 0952   CHOL 179 01/31/2013 0755   TRIG 95 05/13/2014 0952   TRIG 116 01/31/2013 0755   HDL 49 05/13/2014 0952   HDL 36* 01/31/2013 0755   CHOLHDL 3.5 05/13/2014 0952   VLDL 19 05/13/2014 0952   LDLCALC 103* 05/13/2014 0952   LDLCALC 120* 01/31/2013 0755    ABG No results found for: PHART, PCO2ART, PO2ART, HCO3, TCO2, ACIDBASEDEF, O2SAT   Lab Results  Component Value Date   TSH 0.579 01/31/2013   BNP (last 3 results) No results for input(s): BNP in the last 8760 hours.  ProBNP (last 3 results) No results for input(s): PROBNP in the last 8760 hours.  Cardiac Panel (last 3 results) No results for input(s): CKTOTAL, CKMB, TROPONINI, RELINDX in the last 72 hours.  Iron/TIBC/Ferritin/ %Sat No results found for: IRON, TIBC, FERRITIN, IRONPCTSAT   EKG Orders placed or performed in visit on 05/06/15  . EKG 12-Lead     Prior Assessment and Plan Problem List as of 05/06/2015      Cardiovascular and Mediastinum   Essential hypertension   Last Assessment & Plan 05/04/2014 Office Visit Written 05/04/2014 12:21 PM by Isaiah Serge, NP    I refilled current meds.  She will check BP at home and if it stays elevated will let us know.      PFO (patent foramen ovale)   Last Assessment & Plan 05/04/2014 Office Visit Written 05/04/2014 12:22 PM by Isaiah Serge, NP    stable      PAF (paroxysmal atrial fibrillation) [facility])   Last Assessment & Plan 05/04/2014 Office Visit Written 05/04/2014 12:22 PM by Isaiah Serge, NP    No recurrence.         Other   Swelling of skeletal muscle   Pain in thoracic spine   Obesity (BMI 30.0-34.9)   Last Assessment & Plan 05/04/2014 Office Visit Written 05/04/2014 12:23 PM by Isaiah Serge, NP    Discussed importance of exercise, even  if 10 min a day, her work day does include walking.      Hyperlipemia   Last Assessment & Plan 05/04/2014 Office Visit Written  05/04/2014 12:21 PM by Isaiah Serge, NP    Known elevated small particle.  Will recheck cholesterol and place on zetia once results are back.      Palpitations       Imaging: No results found.

## 2015-05-06 NOTE — Patient Instructions (Signed)
Your physician wants you to follow-up in: 1 Year.  You will receive a reminder letter in the mail two months in advance. If you don't receive a letter, please call our office to schedule the follow-up appointment.  Your physician recommends that you continue on your current medications as directed. Please refer to the Current Medication list given to you today.  If you need a refill on your cardiac medications before your next appointment, please call your pharmacy.  Thank you for choosing [facility]!   

## 2015-05-06 NOTE — Progress Notes (Signed)
Cardiology Office Note   Date:  05/06/2015   ID:  Penny Europeeresa F Zhao, DOB [birth year], MRN [id number]  PCP:  Dwana MelenaZack Hall, MD  Cardiologist: Richelle ItoKelly  Rondel Episcopo, NP   Chief Complaint  Patient presents with  . Atrial Fibrillation  . Hypertension      History of Present Illness: Penny Byrd is a 54 y.o. female who presents for ongoing assessment and management of PAF, hypertension, with hx of PFO and morbid obesity.  CHADS VASC Score of 1. She is not on anticoagulation. She is here for annual follow up. Was seen last in 04/2014 .   She comes today without cardiac complaints. She states that on rare occasions she feels her HR up, usually with stress. States it gets better when she calms down. She has been having some stomach burning and has been on non-coated ASA for >20 yrs. She is adherent to her medication regimen. She is also requesting a flu shot.     Past Medical History  Diagnosis Date  . Paroxysmal a-fib ([facility])   . Hypertension   . Palpitation   . Morbid obesity ([facility])   . H/O echocardiogram 09/16/2010    History of small atrioseptal defect/PFO that is unidirectional,(Right to Left)  . History of stress test 06/29/2008    Normal Myocardial perfusion imaging, No significant ischemia demonstrated this is a low risk scan, No prior study availiable for comparison  . Sleep apnea 01/23/2011    AHI during sleep (4h 41min) was 0.0/hr during REMM sleep 0.0/hr, RDI during sleep (4h8441min) was 12.0/hr during REM sleep 1.7/hr  . Diabetes [facility](HCC)     Past Surgical History  Procedure Laterality Date  . Abdominal hysterectomy  age 54  . Breast lumpectomy Right 04/1998    Dr Precious ReelAnita Lindsey, Benign Right breast disease  . Tonsillectomy  2000     Current Outpatient Prescriptions  Medication Sig Dispense Refill  . aspirin 325 MG tablet Take 325 mg by mouth daily.    Marland Kitchen. estradiol (ESTRACE) 1 MG tablet Take 1 tablet by mouth daily.    . metoprolol succinate (TOPROL-XL) 50 MG 24 hr  tablet Take 1 tablet (50 mg total) by mouth daily. Take with or immediately following a meal. 30 tablet 11  . omeprazole (PRILOSEC) 20 MG capsule Take 20 mg by mouth daily.    . valsartan (DIOVAN) 80 MG tablet Take 1 tablet (80 mg total) by mouth daily. 30 tablet 11   No current facility-administered medications for this visit.    Allergies:   Review of patient's allergies indicates no known allergies.    Social History:  The patient  reports that she has never smoked. She has never used smokeless tobacco. She reports that she does not drink alcohol.   Family History:  The patient's family history includes Cancer in her father; Healthy in her sister; Heart disease in her father; Heart failure in her mother; Hypertension in her brother.    ROS: All other systems are reviewed and negative. Unless otherwise mentioned in H&P    PHYSICAL EXAM: VS:  BP 126/76 mmHg  Pulse 53  Ht 5\' 1"  (1.549 m)  Wt 176 lb 9.6 oz (80.105 kg)  BMI 33.39 kg/m2  SpO2 99% , BMI Body mass index is 33.39 kg/(m^2). GEN: Well nourished, well developed, in no acute distress HEENT: normal Neck: no JVD, carotid bruits, or masses Cardiac: RRR; soft systolic murmur at the apex, rubs, or gallops,no edema  Respiratory:  clear to auscultation bilaterally, normal  work of breathing GI: soft, nontender, nondistended, + BS MS: no deformity or atrophy Skin: warm and dry, no rash Neuro:  Strength and sensation are intact Psych: euthymic mood, full affect   EKG:  Normal sinus rhythm rate of 64 bpm.   Recent Labs: 05/13/2014: ALT 14; BUN 19; Creat 0.53; Potassium 4.3; Sodium 138    Lipid Panel    Component Value Date/Time   CHOL 171 05/13/2014 0952   CHOL 179 01/31/2013 0755   TRIG 95 05/13/2014 0952   TRIG 116 01/31/2013 0755   HDL 49 05/13/2014 0952   HDL 36* 01/31/2013 0755   CHOLHDL 3.5 05/13/2014 0952   VLDL 19 05/13/2014 0952   LDLCALC 103* 05/13/2014 0952   LDLCALC 120* 01/31/2013 0755      Wt  Readings from Last 3 Encounters:  05/06/15 176 lb 9.6 oz (80.105 kg)  05/04/14 178 lb 3.2 oz (80.831 kg)  01/30/13 179 lb 1.6 oz (81.239 kg)      Other studies Reviewed: Additional studies/ records that were reviewed today include: None    ASSESSMENT AND PLAN:  1. Paroxysmal Atrial fib: I will refill her metoprolol. She states she sometimes has sluggishness in the afternoons. I have suggested she try taking this at night to see if this is helpful to reduce these symptoms. She will change to ECASA 325 mg daily from non-coated ASA. Check H-pylori  2. Hypertension.  BP is well controlled. I will refill losartan. I will check BMET, CBC, Lipids and LFTS for annual evaluation and send copies to Dr. Margo Aye.         Current medicines are reviewed at length with the patient today.    Labs/ tests ordered today include: BMET, Lipids, LFTs, CBC, H-pylori. Copy to Dr. Margo Aye  Orders Placed This Encounter  Procedures  . EKG 12-Lead     Disposition:   FU with 1 year unless symptomatic.  Signed, Joni Reining, NP  05/06/2015 1:23 PM    [facility] [address], [place], [place] [address] Phone: [phone]; Fax: [phone]

## 2015-05-14 LAB — CBC WITH DIFFERENTIAL/PLATELET
BASOS PCT: 0 % (ref 0–1)
Basophils Absolute: 0 10*3/uL (ref 0.0–0.1)
EOS ABS: 0.2 10*3/uL (ref 0.0–0.7)
EOS PCT: 2 % (ref 0–5)
HCT: 38.7 % (ref 36.0–46.0)
Hemoglobin: 13 g/dL (ref 12.0–15.0)
LYMPHS ABS: 3 10*3/uL (ref 0.7–4.0)
Lymphocytes Relative: 27 % (ref 12–46)
MCH: 29.5 pg (ref 26.0–34.0)
MCHC: 33.6 g/dL (ref 30.0–36.0)
MCV: 88 fL (ref 78.0–100.0)
MONO ABS: 0.8 10*3/uL (ref 0.1–1.0)
MONOS PCT: 7 % (ref 3–12)
MPV: 9.3 fL (ref 8.6–12.4)
NEUTROS PCT: 64 % (ref 43–77)
Neutro Abs: 7.1 10*3/uL (ref 1.7–7.7)
PLATELETS: 351 10*3/uL (ref 150–400)
RBC: 4.4 MIL/uL (ref 3.87–5.11)
RDW: 14.2 % (ref 11.5–15.5)
WBC: 11.1 10*3/uL — ABNORMAL HIGH (ref 4.0–10.5)

## 2015-05-15 LAB — HEPATIC FUNCTION PANEL
ALT: 11 U/L (ref 6–29)
AST: 14 U/L (ref 10–35)
Albumin: 3.4 g/dL — ABNORMAL LOW (ref 3.6–5.1)
Alkaline Phosphatase: 70 U/L (ref 33–130)
Bilirubin, Direct: 0.1 mg/dL
Indirect Bilirubin: 0.2 mg/dL (ref 0.2–1.2)
Total Bilirubin: 0.3 mg/dL (ref 0.2–1.2)
Total Protein: 6.3 g/dL (ref 6.1–8.1)

## 2015-05-15 LAB — LIPID PANEL
CHOL/HDL RATIO: 4.7 ratio (ref <=5.0)
Cholesterol: 178 mg/dL (ref 125–200)
HDL: 38 mg/dL — ABNORMAL LOW (ref >=46)
LDL CALC: 111 mg/dL (ref <130)
Triglycerides: 143 mg/dL (ref <150)
VLDL: 29 mg/dL (ref <30)

## 2015-05-15 LAB — BASIC METABOLIC PANEL
BUN: 17 mg/dL (ref 7–25)
CO2: 24 mmol/L (ref 20–31)
CREATININE: 0.6 mg/dL (ref 0.50–1.05)
Calcium: 8.9 mg/dL (ref 8.6–10.4)
Chloride: 104 mmol/L (ref 98–110)
Glucose, Bld: 95 mg/dL (ref 65–99)
POTASSIUM: 4.3 mmol/L (ref 3.5–5.3)
SODIUM: 138 mmol/L (ref 135–146)

## 2015-05-15 LAB — TSH: TSH: 0.948 u[IU]/mL (ref 0.350–4.500)

## 2015-05-21 ENCOUNTER — Other Ambulatory Visit: Payer: Self-pay | Admitting: Cardiology

## 2015-11-01 ENCOUNTER — Ambulatory Visit: Payer: 59 | Admitting: Adult Health

## 2015-11-02 ENCOUNTER — Ambulatory Visit: Payer: 59 | Admitting: Adult Health

## 2015-11-22 ENCOUNTER — Ambulatory Visit (INDEPENDENT_AMBULATORY_CARE_PROVIDER_SITE_OTHER): Payer: 59 | Admitting: Adult Health

## 2015-11-22 ENCOUNTER — Encounter: Payer: Self-pay | Admitting: Adult Health

## 2015-11-22 ENCOUNTER — Ambulatory Visit (INDEPENDENT_AMBULATORY_CARE_PROVIDER_SITE_OTHER): Payer: 59

## 2015-11-22 VITALS — BP 120/72 | HR 68 | Ht 61.0 in | Wt 179.0 lb

## 2015-11-22 DIAGNOSIS — Q2112 Patent foramen ovale: Secondary | ICD-10-CM

## 2015-11-22 DIAGNOSIS — Q211 Atrial septal defect: Secondary | ICD-10-CM | POA: Diagnosis not present

## 2015-11-22 DIAGNOSIS — R000 Tachycardia, unspecified: Secondary | ICD-10-CM | POA: Diagnosis not present

## 2015-11-22 DIAGNOSIS — I10 Essential (primary) hypertension: Secondary | ICD-10-CM

## 2015-11-22 NOTE — Patient Instructions (Signed)
Medication Instructions:  Your physician recommends that you continue on your current medications as directed. Please refer to the Current Medication list given to you today.   Labwork: Your physician recommends that you return for lab work in: ASAP BMET MAGNESIUM TSH   Testing/Procedures: Your physician has recommended that you wear an event monitor. Event monitors are medical devices that record the heart's electrical activity. Doctors most often us these monitors to diagnose arrhythmias. Arrhythmias are problems with the speed or rhythm of the heartbeat. The monitor is a small, portable device. You can wear one while you do your normal daily activities. This is usually used to diagnose what is causing palpitations/syncope (passing out).  * 2 WEEKS **   Follow-Up: Your physician recommends that you schedule a follow-up appointment in: 1 MONTH    Any Other Special Instructions Will Be Listed Below (If Applicable).     If you need a refill on your cardiac medications before your next appointment, please call your pharmacy.

## 2015-11-22 NOTE — Progress Notes (Deleted)
Name: Penny Byrd    DOB: 08-31-1960  Age: 55 y.o.  MR#: [id number]       PCP:  Wende Neighbors, MD      Insurance: Payor: Onnie Boer / Plan: [employer] OTHER / Product Type: *No Product type* /   CC:   No chief complaint on file.   VS Filed Vitals:   11/22/15 1542  BP: 120/72  Pulse: 68  Height: _0  (1.549 m)  Weight: 179 lb (81.194 kg)  SpO2: 98%    Weights Current Weight  11/22/15 179 lb (81.194 kg)  05/06/15 176 lb 9.6 oz (80.105 kg)  05/04/14 178 lb 3.2 oz (80.831 kg)    Blood Pressure  BP Readings from Last 3 Encounters:  11/22/15 120/72  05/06/15 126/76  05/04/14 148/86     Admit date:  (Not on file) Last encounter with RMR:  Visit date not found   Allergy Review of patient's allergies indicates no known allergies.  Current Outpatient Prescriptions  Medication Sig Dispense Refill  . aspirin EC 325 MG tablet Take 1 tablet (325 mg total) by mouth daily. 30 tablet 11  . estradiol (ESTRACE) 1 MG tablet Take 1 tablet by mouth daily.    . metoprolol succinate (TOPROL-XL) 50 MG 24 hr tablet Take 1 tablet (50 mg total) by mouth daily after supper. Take with or immediately following a meal. 30 tablet 11  . omeprazole (PRILOSEC) 20 MG capsule Take 20 mg by mouth daily.    . valsartan (DIOVAN) 80 MG tablet TAKE ONE (1) TABLET BY MOUTH EVERY DAY 30 tablet 11   No current facility-administered medications for this visit.    Discontinued Meds:   There are no discontinued medications.  Patient Active Problem List   Diagnosis Date Noted  . Obesity (BMI 30.0-34.9) 02/04/2013  . Hyperlipemia 02/04/2013  . Essential hypertension 02/04/2013  . PFO (patent foramen ovale) 02/04/2013  . Palpitations 02/04/2013  . PAF (paroxysmal atrial fibrillation) ([place]) 02/04/2013  . Swelling of skeletal muscle 10/10/2011  . Pain in thoracic spine 10/10/2011    LABS    Component Value Date/Time   NA 138 05/14/2015 0826   NA 138 05/13/2014 0952   NA 140 01/31/2013 0755    K 4.3 05/14/2015 0826   K 4.3 05/13/2014 0952   K 4.7 01/31/2013 0755   CL 104 05/14/2015 0826   CL 103 05/13/2014 0952   CL 106 01/31/2013 0755   CO2 24 05/14/2015 0826   CO2 26 05/13/2014 0952   CO2 27 01/31/2013 0755   GLUCOSE 95 05/14/2015 0826   GLUCOSE 91 05/13/2014 0952   GLUCOSE 110* 01/31/2013 0755   BUN 17 05/14/2015 0826   BUN 19 05/13/2014 0952   BUN 16 01/31/2013 0755   CREATININE 0.60 05/14/2015 0826   CREATININE 0.53 05/13/2014 0952   CREATININE 0.58 01/31/2013 0755   CREATININE 0.49 10/18/2009 1204   CREATININE 0.47 07/06/2008 1400   CALCIUM 8.9 05/14/2015 0826   CALCIUM 9.0 05/13/2014 0952   CALCIUM 9.6 01/31/2013 0755   GFRNONAA >60 10/18/2009 1204   GFRNONAA >60 07/06/2008 1400   GFRAA  10/18/2009 1204    >60        The eGFR has been calculated using the MDRD equation. This calculation has not been validated in all clinical situations. eGFR's persistently <60 mL/min signify possible Chronic Kidney Disease.   GFRAA  07/06/2008 1400    >60        The eGFR has been calculated using  the MDRD equation. This calculation has not been validated in all clinical   CMP     Component Value Date/Time   NA 138 05/14/2015 0826   K 4.3 05/14/2015 0826   CL 104 05/14/2015 0826   CO2 24 05/14/2015 0826   GLUCOSE 95 05/14/2015 0826   BUN 17 05/14/2015 0826   CREATININE 0.60 05/14/2015 0826   CREATININE 0.49 10/18/2009 1204   CALCIUM 8.9 05/14/2015 0826   PROT 6.3 05/14/2015 0826   ALBUMIN 3.4* 05/14/2015 0826   AST 14 05/14/2015 0826   ALT 11 05/14/2015 0826   ALKPHOS 70 05/14/2015 0826   BILITOT 0.3 05/14/2015 0826   GFRNONAA >60 10/18/2009 1204   GFRAA  10/18/2009 1204    >60        The eGFR has been calculated using the MDRD equation. This calculation has not been validated in all clinical situations. eGFR's persistently <60 mL/min signify possible Chronic Kidney Disease.       Component Value Date/Time   WBC 11.1* 05/14/2015 0826    WBC 8.6 01/31/2013 0755   HGB 13.0 05/14/2015 0826   HGB 13.9 01/31/2013 0755   HGB 14.1 10/21/2009 1309   HCT 38.7 05/14/2015 0826   HCT 39.6 01/31/2013 0755   MCV 88.0 05/14/2015 0826   MCV 87.0 01/31/2013 0755    Lipid Panel     Component Value Date/Time   CHOL 178 05/14/2015 0826   CHOL 179 01/31/2013 0755   TRIG 143 05/14/2015 0826   TRIG 116 01/31/2013 0755   HDL 38* 05/14/2015 0826   HDL 36* 01/31/2013 0755   CHOLHDL 4.7 05/14/2015 0826   VLDL 29 05/14/2015 0826   LDLCALC 111 05/14/2015 0826   LDLCALC 120* 01/31/2013 0755    ABG No results found for: PHART, PCO2ART, PO2ART, HCO3, TCO2, ACIDBASEDEF, O2SAT   Lab Results  Component Value Date   TSH 0.948 05/14/2015   BNP (last 3 results) No results for input(s): BNP in the last 8760 hours.  ProBNP (last 3 results) No results for input(s): PROBNP in the last 8760 hours.  Cardiac Panel (last 3 results) No results for input(s): CKTOTAL, CKMB, TROPONINI, RELINDX in the last 72 hours.  Iron/TIBC/Ferritin/ %Sat No results found for: IRON, TIBC, FERRITIN, IRONPCTSAT   EKG Orders placed or performed in visit on 05/06/15  . EKG 12-Lead     Prior Assessment and Plan Problem List as of 11/22/2015      Cardiovascular and Mediastinum   Essential hypertension   Last Assessment & Plan 05/04/2014 Office Visit Written 05/04/2014 12:21 PM by Isaiah Serge, NP    I refilled current meds.  She will check BP at home and if it stays elevated will let us know.      PFO (patent foramen ovale)   Last Assessment & Plan 05/04/2014 Office Visit Written 05/04/2014 12:22 PM by Isaiah Serge, NP    stable      PAF (paroxysmal atrial fibrillation) [facility])   Last Assessment & Plan 05/04/2014 Office Visit Written 05/04/2014 12:22 PM by Isaiah Serge, NP    No recurrence.         Other   Swelling of skeletal muscle   Pain in thoracic spine   Obesity (BMI 30.0-34.9)   Last Assessment & Plan 05/04/2014 Office Visit Written  05/04/2014 12:23 PM by Isaiah Serge, NP    Discussed importance of exercise, even if 10 min a day, her work day does include walking.  Hyperlipemia   Last Assessment & Plan 05/04/2014 Office Visit Written 05/04/2014 12:21 PM by Isaiah Serge, NP    Known elevated small particle.  Will recheck cholesterol and place on zetia once results are back.      Palpitations       Imaging: No results found.

## 2015-11-22 NOTE — Progress Notes (Signed)
Cardiology Office Note   Date:  11/22/2015   ID:  Penny Byrd, DOB Jun 19, 1961, MRN [id number]  PCP:  Dwana MelenaZack Hall, MD  Cardiologist:  Kelly/ Joni ReiningKathryn Armelia Penton, NP   No chief complaint on file.     History of Present Illness: Penny Byrd is a 55 y.o. female who presents for ongoing assessment and management of PAF, hypertension, with hx of PFO and morbid obesity. CHADS VASC Score of 1. She is not on anticoagulation she was last seen in October of 2016 and was stable from cardiac standpoint.  She is here day with complaints of repeated rapid heart rhythm that is transient. She states that her heart begin to race with no apparent reason, she could be sitting, walking, or driving her car. It lasts several seconds and goes away on its own. She does feel dizzy and lightheaded during that time. She denies any associated chest pain but she does have mild trouble breathing during the rapid heart rhythm and once it resolves she is able to breathe normally.  Past Medical History  Diagnosis Date  . Paroxysmal a-fib (HCC)   . Hypertension   . Palpitation   . Morbid obesity (HCC)   . H/O echocardiogram 09/16/2010    History of small atrioseptal defect/PFO that is unidirectional,(Right to Left)  . History of stress test 06/29/2008    Normal Myocardial perfusion imaging, No significant ischemia demonstrated this is a low risk scan, No prior study availiable for comparison  . Sleep apnea 01/23/2011    AHI during sleep (4h 41min) was 0.0/hr during REMM sleep 0.0/hr, RDI during sleep (4h7341min) was 12.0/hr during REM sleep 1.7/hr  . Diabetes [facility](HCC)     Past Surgical History  Procedure Laterality Date  . Abdominal hysterectomy  age 55  . Breast lumpectomy Right 04/1998    Dr Precious ReelAnita Lindsey, Benign Right breast disease  . Tonsillectomy  2000     Current Outpatient Prescriptions  Medication Sig Dispense Refill  . aspirin EC 325 MG tablet Take 1 tablet (325 mg total) by mouth daily. 30 tablet  11  . estradiol (ESTRACE) 1 MG tablet Take 1 tablet by mouth daily.    . metoprolol succinate (TOPROL-XL) 50 MG 24 hr tablet Take 1 tablet (50 mg total) by mouth daily after supper. Take with or immediately following a meal. 30 tablet 11  . omeprazole (PRILOSEC) 20 MG capsule Take 20 mg by mouth daily.    . valsartan (DIOVAN) 80 MG tablet TAKE ONE (1) TABLET BY MOUTH EVERY DAY 30 tablet 11   No current facility-administered medications for this visit.    Allergies:   Review of patient's allergies indicates no known allergies.    Social History:  The patient  reports that she has never smoked. She has never used smokeless tobacco. She reports that she does not drink alcohol.   Family History:  The patient's family history includes Cancer in her father; Healthy in her sister; Heart disease in her father; Heart failure in her mother; Hypertension in her brother.    ROS: All other systems are reviewed and negative. Unless otherwise mentioned in H&P    PHYSICAL EXAM: VS:  BP 120/72 mmHg  Pulse 68  Ht 5\' 1"  (1.549 m)  Wt 179 lb (81.194 kg)  BMI 33.84 kg/m2  SpO2 98% , BMI Body mass index is 33.84 kg/(m^2). GEN: Well nourished, well developed, in no acute distress HEENT: normal Neck: no JVD, carotid bruits, or masses Cardiac: RRR; no murmurs, rubs,  or gallops,no edema  Respiratory:  clear to auscultation bilaterally, normal work of breathing GI: soft, nontender, nondistended, + BS MS: no deformity or atrophy Skin: warm and dry, no rash Neuro:  Strength and sensation are intact Psych: euthymic mood, full affect  Recent Labs: 05/14/2015: ALT 11; BUN 17; Creat 0.60; Hemoglobin 13.0; Platelets 351; Potassium 4.3; Sodium 138; TSH 0.948    Lipid Panel    Component Value Date/Time   CHOL 178 05/14/2015 0826   CHOL 179 01/31/2013 0755   TRIG 143 05/14/2015 0826   TRIG 116 01/31/2013 0755   HDL 38* 05/14/2015 0826   HDL 36* 01/31/2013 0755   CHOLHDL 4.7 05/14/2015 0826   VLDL 29  05/14/2015 0826   LDLCALC 111 05/14/2015 0826   LDLCALC 120* 01/31/2013 0755      Wt Readings from Last 3 Encounters:  11/22/15 179 lb (81.194 kg)  05/06/15 176 lb 9.6 oz (80.105 kg)  05/04/14 178 lb 3.2 oz (80.831 kg)      Other studies Reviewed: Additional studies/ records that were reviewed today include: echocardiogram Review of the above records demonstrates: most recent echocardiogram dated 09/16/2010 revealing LV systolic function was normal greater than 55%. Stage I diastolic dysfunction.she was found to have by family micro-contrast a small left to right shunt at atrial level by color Doppler. This was consistent with a new unidirectional PFO.    ASSESSMENT AND PLAN:  1. Intermittent apid heart rate:she has been experiencing episodes of racing heart rate 2-3 times a week, usually lasting several seconds at a time, not associated with activity or exertion. She states that she can feel her while she is driving or walking, or at work. I will place a cardiac monitor on her to evaluate further, wearing it for 2 weeks, for definitive evaluation of rhythm.  She is on a PPI, I will check a magnesium levelto evaluate further. I will also  BMET and also TSH.in the interim she will continue metoprolol 25 mg daily. I have told her that if her heart rate begins to race and lasts longer than 15-20 minutes she can take an additional half a tablet of metoprolol. If this does not resolve with this she is to come to ER.  2. Hypertension:continue valsartan 80 mg daily along with metoprolol. Blood pressure is controlled currently.  3. History of PFO: Last echo was in 2012.was cardiac monitor has been removed will repeat echocardiogram.   Current medicines are reviewed at length with the patient today.    Labs/ tests ordered today include: to a cardiac monitor, TSH, BMET, magnesium. No orders of the defined types were placed in this encounter.     Disposition:   FU with 1 one month    Signed, Joni Reining, NP  11/22/2015 3:57 PM    [facility] [address], [place], [place] [address] Phone: [phone]; Fax: [phone]

## 2015-11-23 ENCOUNTER — Telehealth: Payer: Self-pay | Admitting: *Deleted

## 2015-11-23 LAB — BASIC METABOLIC PANEL
BUN: 18 mg/dL (ref 7–25)
CALCIUM: 9.1 mg/dL (ref 8.6–10.4)
CO2: 28 mmol/L (ref 20–31)
Chloride: 105 mmol/L (ref 98–110)
Creat: 0.59 mg/dL (ref 0.50–1.05)
GLUCOSE: 92 mg/dL (ref 65–99)
POTASSIUM: 4.1 mmol/L (ref 3.5–5.3)
SODIUM: 139 mmol/L (ref 135–146)

## 2015-11-23 LAB — TSH: TSH: 1.23 m[IU]/L

## 2015-11-23 LAB — MAGNESIUM: MAGNESIUM: 1.7 mg/dL (ref 1.5–2.5)

## 2015-11-23 MED ORDER — MAGNESIUM 400 MG PO TABS
400.0000 mg | ORAL_TABLET | Freq: Every day | ORAL | Status: DC
Start: 2015-11-23 — End: 2016-08-16

## 2015-11-23 NOTE — Telephone Encounter (Signed)
-----   Message from Jodelle GrossKathryn M Lawrence, NP sent at 11/23/2015  6:56 AM EDT ----- Magnesium is low. Begin Magnesium 400 mg daily. Continue to wear cardiac monitor.

## 2015-11-23 NOTE — Telephone Encounter (Signed)
Called patient with test results. No answer. Left message to call back.  

## 2015-12-07 ENCOUNTER — Telehealth: Payer: Self-pay | Admitting: *Deleted

## 2015-12-07 NOTE — Telephone Encounter (Signed)
-----   Message from Jodelle GrossKathryn M Lawrence, NP sent at 12/06/2015  4:17 PM EDT ----- Cardiac monitor did not reveal any significant arrhythmias or abnormal heart rates. No changes in regimen.

## 2015-12-07 NOTE — Telephone Encounter (Signed)
Called patient with test results. No answer. Left message to call back.  

## 2015-12-08 NOTE — Telephone Encounter (Signed)
Patient called to get her results for her heart monitor. I let her know what the result note said. Patient is wondering if she needs to keep her 1 month f/u appointment still?

## 2015-12-09 ENCOUNTER — Encounter: Payer: Self-pay | Admitting: Adult Health

## 2015-12-24 ENCOUNTER — Ambulatory Visit: Payer: 59 | Admitting: Adult Health

## 2016-05-08 ENCOUNTER — Other Ambulatory Visit: Payer: Self-pay | Admitting: Adult Health

## 2016-08-14 ENCOUNTER — Encounter: Payer: Self-pay | Admitting: Physician Assistant

## 2016-08-14 NOTE — Progress Notes (Deleted)
Cardiology Office Note    Date:  08/14/2016  ID:  Penny Europeeresa F Baltz, DOB [birth year], MRN [id number] PCP:  Dwana MelenaZack Hall, MD  Cardiologist:  ***   Chief Complaint: ***  History of Present Illness:  Penny Byrd is a 56 y.o. female with history of ***    Past Medical History:  Diagnosis Date  . Diabetes (HCC)   . GERD (gastroesophageal reflux disease)   . H/O echocardiogram 09/16/2010   History of small atrioseptal defect/PFO that is unidirectional,(Right to Left)  . History of stress test 06/29/2008   Normal Myocardial perfusion imaging, No significant ischemia demonstrated this is a low risk scan, No prior study availiable for comparison  . Hypertension   . Obesity   . Paroxysmal atrial fibrillation (HCC)   . PVC's (premature ventricular contractions)    a. rare PVCs on event monitor in 2017.  Marland Kitchen. Sleep apnea 01/23/2011   AHI during sleep (4h 41min) was 0.0/hr during REMM sleep 0.0/hr, RDI during sleep (4h841min) was 12.0/hr during REM sleep 1.7/hr    Past Surgical History:  Procedure Laterality Date  . ABDOMINAL HYSTERECTOMY  age 56  . BREAST LUMPECTOMY Right 04/1998   Dr Precious ReelAnita Lindsey, Benign Right breast disease  . TONSILLECTOMY  2000    Current Medications: Current Outpatient Prescriptions  Medication Sig Dispense Refill  . aspirin EC 325 MG tablet Take 1 tablet (325 mg total) by mouth daily. 30 tablet 11  . estradiol (ESTRACE) 1 MG tablet Take 1 tablet by mouth daily.    . Magnesium 400 MG TABS Take 400 mg by mouth daily. 30 tablet 11  . metoprolol succinate (TOPROL-XL) 50 MG 24 hr tablet TAKE ONE TABLET BY MOUTH DAILY WITH OR IMMEDIATELY FOLLOWING SUPPER. 30 tablet 6  . omeprazole (PRILOSEC) 20 MG capsule Take 20 mg by mouth daily.    . valsartan (DIOVAN) 80 MG tablet TAKE ONE (1) TABLET BY MOUTH EVERY DAY 30 tablet 11   No current facility-administered medications for this visit.      Allergies:   Patient has no known allergies.   Social History   Social  History  . Marital status: Married    Spouse name: N/A  . Number of children: N/A  . Years of education: N/A   Social History Main Topics  . Smoking status: Never Smoker  . Smokeless tobacco: Never Used  . Alcohol use No  . Drug use: Unknown  . Sexual activity: Not on file   Other Topics Concern  . Not on file   Social History Narrative  . No narrative on file     Family History:  The patient's family history includes Cancer in her father; Healthy in her sister; Heart disease in her father; Heart failure in her mother; Hypertension in her brother. ***  ROS:   Please see the history of present illness. Otherwise, review of systems is positive for ***.  All other systems are reviewed and otherwise negative.    PHYSICAL EXAM:   VS:  There were no vitals taken for this visit.  BMI: There is no height or weight on file to calculate BMI. GEN: Well nourished, well developed, in no acute distress HEENT: normocephalic, atraumatic Neck: no JVD, carotid bruits, or masses Cardiac: ***RRR; no murmurs, rubs, or gallops, no edema  Respiratory:  clear to auscultation bilaterally, normal work of breathing GI: soft, nontender, nondistended, + BS MS: no deformity or atrophy Skin: warm and dry, no rash Neuro:  Alert and Oriented x  3, Strength and sensation are intact, follows commands Psych: euthymic mood, full affect  Wt Readings from Last 3 Encounters:  11/22/15 179 lb (81.2 kg)  05/06/15 176 lb 9.6 oz (80.1 kg)  05/04/14 178 lb 3.2 oz (80.8 kg)      Studies/Labs Reviewed:   EKG:  EKG was ordered today and personally reviewed by me and demonstrates *** EKG was not ordered today.***  Recent Labs: 11/22/2015: BUN 18; Creat 0.59; Magnesium 1.7; Potassium 4.1; Sodium 139; TSH 1.23   Lipid Panel    Component Value Date/Time   CHOL 178 05/14/2015 0826   CHOL 179 01/31/2013 0755   TRIG 143 05/14/2015 0826   TRIG 116 01/31/2013 0755   HDL 38 (L) 05/14/2015 0826   HDL 36 (L)  01/31/2013 0755   CHOLHDL 4.7 05/14/2015 0826   VLDL 29 05/14/2015 0826   LDLCALC 111 05/14/2015 0826   LDLCALC 120 (H) 01/31/2013 0755    Additional studies/ records that were reviewed today include: Summarized above.***    ASSESSMENT & PLAN:   1. Palpitations 2. Paroxysmal atrial fib 3. PFO 4. Essential HTN 5. Dyslipidemia  Disposition: F/u with ***   Medication Adjustments/Labs and Tests Ordered: Current medicines are reviewed at length with the patient today.  Concerns regarding medicines are outlined above. Medication changes, Labs and Tests ordered today are summarized above and listed in the Patient Instructions accessible in Encounters.   Thomasene Mohair PA-C  08/14/2016 3:01 PM    [facility] [address] [place], [place], [place]  [address] Phone: [phone]; Fax: [phone]

## 2016-08-15 ENCOUNTER — Ambulatory Visit: Payer: 59 | Admitting: Physician Assistant

## 2016-08-15 ENCOUNTER — Encounter: Payer: Self-pay | Admitting: Cardiology

## 2016-08-15 NOTE — Progress Notes (Signed)
Cardiology Office Note  Date: 08/16/2016   ID: Penny Byrd, DOB [birth year], MRN [id number]  PCP: Dwana MelenaZack Hall, MD  Evaluating Cardiologist: Nona DellSamuel Eaton Folmar, MD   Chief Complaint  Patient presents with  . Irregular heartbeat    History of Present Illness: Penny Europeeresa F Beg is a 56 y.o. female that I am meeting for the first time in clinic today. She is a former patient of Dr. Tresa EndoKelly, most recently seen by Ms. Lawrence NP in May 2017. I reviewed her records and updated the chart. She presents today stating that she has had intermittent feelings of palpitations, describes a skipping. She has been under a lot of stress recently. She does not report any lightheadedness or syncope. In the last week she has actually felt better.  14 day event recorder from May 2017 showed sinus rhythm with rare PVCs and lead artifact, no obvious atrial fibrillation.  I reviewed her ECG today which shows sinus rhythm with PACs. We went over the strip together. She reports compliance with Toprol-XL 50 mg daily.  Past Medical History:  Diagnosis Date  . Essential hypertension   . GERD (gastroesophageal reflux disease)   . History of stress test 06/29/2008   Normal Myocardial perfusion imaging, No significant ischemia demonstrated this is a low risk scan, No prior study availiable for comparison  . Obesity   . Paroxysmal atrial fibrillation (HCC)   . PVC's (premature ventricular contractions)    a. rare PVCs on event monitor in 2017.  Marland Kitchen. Sleep apnea 01/23/2011   AHI during sleep (4h 41min) was 0.0/hr during REMM sleep 0.0/hr, RDI during sleep (4h7041min) was 12.0/hr during REM sleep 1.7/hr  . Type 2 diabetes mellitus (HCC)     Past Surgical History:  Procedure Laterality Date  . ABDOMINAL HYSTERECTOMY  age 56  . BREAST LUMPECTOMY Right 04/1998   Dr Precious ReelAnita Lindsey, Benign Right breast disease  . TONSILLECTOMY  2000    Current Outpatient Prescriptions  Medication Sig Dispense Refill  . aspirin EC 325  MG tablet Take 1 tablet (325 mg total) by mouth daily. 30 tablet 11  . estradiol (ESTRACE) 1 MG tablet Take 1 tablet by mouth daily.    . metoprolol succinate (TOPROL-XL) 50 MG 24 hr tablet TAKE ONE TABLET BY MOUTH DAILY WITH OR IMMEDIATELY FOLLOWING SUPPER. 30 tablet 6  . Omega-3 Fatty Acids (FISH OIL) 1200 MG CAPS Take 1,200 mg by mouth.    Marland Kitchen. omeprazole (PRILOSEC) 20 MG capsule Take 20 mg by mouth daily.    . valsartan (DIOVAN) 80 MG tablet TAKE ONE (1) TABLET BY MOUTH EVERY DAY 30 tablet 11   No current facility-administered medications for this visit.    Allergies:  Patient has no known allergies.   Social History: The patient  reports that she has never smoked. She has never used smokeless tobacco. She reports that she does not drink alcohol.   ROS:  Please see the history of present illness. Otherwise, complete review of systems is positive for none.  All other systems are reviewed and negative.   Physical Exam: VS:  BP 126/76   Pulse 80   Ht 5' 1.5" (1.562 m)   Wt 178 lb (80.7 kg)   SpO2 92%   BMI 33.09 kg/m , BMI Body mass index is 33.09 kg/m.  Wt Readings from Last 3 Encounters:  08/16/16 178 lb (80.7 kg)  11/22/15 179 lb (81.2 kg)  05/06/15 176 lb 9.6 oz (80.1 kg)    General: Overweight woman,  appears comfortable at rest. HEENT: Conjunctiva and lids normal, oropharynx clear. Neck: Supple, no elevated JVP or carotid bruits, no thyromegaly. Lungs: Clear to auscultation, nonlabored breathing at rest. Cardiac: Regular rate and rhythm, no S3 or significant systolic murmur, no pericardial rub. Abdomen: Soft, nontender, bowel sounds present, no guarding or rebound. Extremities: No pitting edema, distal pulses 2+. Skin: Warm and dry. Musculoskeletal: No kyphosis. Neuropsychiatric: Alert and oriented x3, affect grossly appropriate.  ECG: I personally reviewed the tracing from 05/06/2015 which showed normal sinus rhythm.  Recent Labwork:  December 2017: Cholesterol 179,  HDL 44, triglycerides 195, LDL 104, BUN 18, creatinine 0.6, potassium 4.6, AST 14, ALT 13, hemoglobin 13.2, platelets 382  Other Studies Reviewed Today:  Echocardiogram 09/16/2010 [facility]): Borderline LVH with LVEF greater than 55%, grade 1 diastolic dysfunction, normal right ventricular contraction, mild left atrial enlargement, PFO with small left-to-right shunt, mild mitral annular calcification with trace mitral regurgitation, trace tricuspid regurgitation, mild pulmonic regurgitation, no pericardial effusion.  Assessment and Plan:  1. Palpitations, PACs documented by ECG. Feeling better over the last week. Without syncope, we will continue with observation and continue current dose of Toprol-XL. This could be further advanced if needed.  2. Essential hypertension, also on Diovan. Blood pressure is adequately controlled today.  3. Previously documented PVCs. LVEF normal range by prior evaluation.  4. Incidentally noted PFO with small left to right shunt by echocardiogram in 2012. No evidence of RV dysfunction at that point. She is on aspirin.  Current medicines were reviewed with the patient today.   Orders Placed This Encounter  Procedures  . EKG 12-Lead    Disposition: Follow-up in 6 months.  Signed, Jonelle Sidle, MD, [facility] 08/16/2016 11:11 AM    [facility] [address] [place], [place] [address] Phone: [phone]; Fax: [phone]

## 2016-08-16 ENCOUNTER — Ambulatory Visit (INDEPENDENT_AMBULATORY_CARE_PROVIDER_SITE_OTHER): Payer: 59 | Admitting: Cardiology

## 2016-08-16 ENCOUNTER — Encounter: Payer: Self-pay | Admitting: Cardiology

## 2016-08-16 VITALS — BP 126/76 | HR 80 | Ht 61.5 in | Wt 178.0 lb

## 2016-08-16 DIAGNOSIS — Q2112 Patent foramen ovale: Secondary | ICD-10-CM

## 2016-08-16 DIAGNOSIS — I491 Atrial premature depolarization: Secondary | ICD-10-CM | POA: Diagnosis not present

## 2016-08-16 DIAGNOSIS — I10 Essential (primary) hypertension: Secondary | ICD-10-CM | POA: Diagnosis not present

## 2016-08-16 DIAGNOSIS — R002 Palpitations: Secondary | ICD-10-CM | POA: Diagnosis not present

## 2016-08-16 DIAGNOSIS — Q211 Atrial septal defect: Secondary | ICD-10-CM | POA: Diagnosis not present

## 2016-08-16 NOTE — Patient Instructions (Signed)

## 2016-10-24 ENCOUNTER — Emergency Department (HOSPITAL_COMMUNITY)
Admission: EM | Admit: 2016-10-24 | Discharge: 2016-10-24 | Disposition: A | Discharge status: Home / Self Care | Payer: Worker's Compensation | Attending: Emergency Medicine | Admitting: Emergency Medicine

## 2016-10-24 ENCOUNTER — Emergency Department (HOSPITAL_COMMUNITY): Payer: Worker's Compensation

## 2016-10-24 ENCOUNTER — Encounter (HOSPITAL_COMMUNITY): Payer: Self-pay | Admitting: Cardiology

## 2016-10-24 DIAGNOSIS — Z79899 Other long term (current) drug therapy: Secondary | ICD-10-CM | POA: Diagnosis not present

## 2016-10-24 DIAGNOSIS — Z7982 Long term (current) use of aspirin: Secondary | ICD-10-CM | POA: Diagnosis not present

## 2016-10-24 DIAGNOSIS — I10 Essential (primary) hypertension: Secondary | ICD-10-CM | POA: Insufficient documentation

## 2016-10-24 DIAGNOSIS — Y929 Unspecified place or not applicable: Secondary | ICD-10-CM | POA: Diagnosis not present

## 2016-10-24 DIAGNOSIS — S6991XA Unspecified injury of right wrist, hand and finger(s), initial encounter: Secondary | ICD-10-CM | POA: Diagnosis present

## 2016-10-24 DIAGNOSIS — S52541A Smith's fracture of right radius, initial encounter for closed fracture: Secondary | ICD-10-CM | POA: Diagnosis not present

## 2016-10-24 DIAGNOSIS — E119 Type 2 diabetes mellitus without complications: Secondary | ICD-10-CM | POA: Insufficient documentation

## 2016-10-24 DIAGNOSIS — S52611G Displaced fracture of right ulna styloid process, subsequent encounter for closed fracture with delayed healing: Secondary | ICD-10-CM

## 2016-10-24 DIAGNOSIS — S52611D Displaced fracture of right ulna styloid process, subsequent encounter for closed fracture with routine healing: Secondary | ICD-10-CM | POA: Diagnosis not present

## 2016-10-24 DIAGNOSIS — W010XXA Fall on same level from slipping, tripping and stumbling without subsequent striking against object, initial encounter: Secondary | ICD-10-CM | POA: Diagnosis not present

## 2016-10-24 DIAGNOSIS — Y9389 Activity, other specified: Secondary | ICD-10-CM | POA: Diagnosis not present

## 2016-10-24 DIAGNOSIS — Y998 Other external cause status: Secondary | ICD-10-CM | POA: Diagnosis not present

## 2016-10-24 MED ORDER — FENTANYL CITRATE (PF) 100 MCG/2ML IJ SOLN
50.0000 ug | INTRAMUSCULAR | Status: DC | PRN
  Administered 2016-10-24: 50 ug via INTRAVENOUS
  Filled 2016-10-24 (×2): qty 2

## 2016-10-24 MED ORDER — PROPOFOL 10 MG/ML IV BOLUS
INTRAVENOUS | Status: AC | PRN
  Administered 2016-10-24: 35 mg via INTRAVENOUS

## 2016-10-24 MED ORDER — OXYCODONE-ACETAMINOPHEN 5-325 MG PO TABS
1.0000 | ORAL_TABLET | Freq: Once | ORAL | Status: AC
  Administered 2016-10-24: 1 via ORAL
  Filled 2016-10-24: qty 1

## 2016-10-24 MED ORDER — FENTANYL CITRATE (PF) 100 MCG/2ML IJ SOLN
50.0000 ug | Freq: Once | INTRAMUSCULAR | Status: AC
  Administered 2016-10-24: 50 ug via INTRAVENOUS

## 2016-10-24 MED ORDER — PROPOFOL 10 MG/ML IV BOLUS
1.0000 mg/kg | Freq: Once | INTRAVENOUS | Status: AC
  Administered 2016-10-24: 79.4 mg via INTRAVENOUS
  Filled 2016-10-24: qty 20

## 2016-10-24 MED ORDER — OXYCODONE-ACETAMINOPHEN 5-325 MG PO TABS: 1.0000 | ORAL_TABLET | ORAL | 0 refills | Status: DC | PRN

## 2016-10-24 NOTE — ED Provider Notes (Signed)
[facility]-EMERGENCY DEPT Provider Note   CSN: [id number] Arrival date & time: 10/24/16  1322     History   Chief Complaint Chief Complaint  Patient presents with  . Wrist Pain    HPI Penny Byrd is a 56 y.o. female.  The history is provided by the patient.  Wrist Pain  This is a new problem. The current episode started 1 to 2 hours ago. The problem occurs constantly. The problem has not changed since onset.Pertinent negatives include no chest pain, no abdominal pain, no headaches and no shortness of breath. Exacerbated by:  movement. Nothing relieves the symptoms. She has tried nothing for the symptoms.   Right hand dominant. History of HTN, PAF on aspirin, and DM. 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 Tried to catch her fall with right hand and sustained a deformity. No head strike or LOC. Right knee pain from fall but ambulating on it without difficulty. No numbness or weakness. No other injuries.   Past Medical History:  Diagnosis Date  . Essential hypertension   . GERD (gastroesophageal reflux disease)   . History of stress test 06/29/2008   Normal Myocardial perfusion imaging, No significant ischemia demonstrated this is a low risk scan, No prior study availiable for comparison  . Obesity   . Paroxysmal atrial fibrillation (HCC)   . PVC's (premature ventricular contractions)    a. rare PVCs on event monitor in 2017.  Marland Kitchen Sleep apnea 01/23/2011   AHI during sleep (4h ) was 0.0/hr during REMM sleep 0.0/hr, RDI during sleep (4h68min) was 12.0/hr during REM sleep 1.7/hr  . Type 2 diabetes mellitus [facility])     Patient Active Problem List   Diagnosis Date Noted  . Obesity (BMI 30.0-34.9) 02/04/2013  . Dyslipidemia 02/04/2013  . Essential hypertension 02/04/2013  . PFO (patent foramen ovale) 02/04/2013  . Palpitations 02/04/2013  . PAF (paroxysmal atrial fibrillation) (HCC) 02/04/2013  . Swelling of skeletal muscle 10/10/2011  . Pain in thoracic spine 10/10/2011     Past Surgical History:  Procedure Laterality Date  . ABDOMINAL HYSTERECTOMY  age 48  . BREAST LUMPECTOMY Right 04/1998   Dr Precious Reel, Benign Right breast disease  . TONSILLECTOMY  2000    OB History    No data available       Home Medications    Prior to Admission medications   Medication Sig Start Date End Date Taking? Authorizing Provider  aspirin EC 325 MG tablet Take 1 tablet (325 mg total) by mouth daily. 05/06/15  Yes Jodelle Gross, NP  estradiol (ESTRACE) 1 MG tablet Take 1 tablet by mouth daily. 04/11/14  Yes Historical Provider, MD  metoprolol succinate (TOPROL-XL) 50 MG 24 hr tablet TAKE ONE TABLET BY MOUTH DAILY WITH OR IMMEDIATELY FOLLOWING SUPPER. 05/08/16  Yes Jodelle Gross, NP  Omega-3 Fatty Acids (FISH OIL) 1200 MG CAPS Take 1,200 mg by mouth daily.    Yes Historical Provider, MD  omeprazole (PRILOSEC) 20 MG capsule Take 20 mg by mouth daily.   Yes Historical Provider, MD  valsartan (DIOVAN) 80 MG tablet TAKE ONE (1) TABLET BY MOUTH EVERY DAY 05/21/15  Yes Leone Brand, NP  oxyCODONE-acetaminophen (PERCOCET/ROXICET) 5-325 MG tablet Take 1-2 tablets by mouth every 4 (four) hours as needed for moderate pain or severe pain. 10/24/16   Lavera Guise, MD    Family History Family History  Problem Relation Age of Onset  . Heart failure Mother   . Cancer Father   .  Heart disease Father   . Healthy Sister   . Hypertension Brother     Social History Social History  Substance Use Topics  . Smoking status: Never Smoker  . Smokeless tobacco: Never Used  . Alcohol use No     Allergies   Patient has no known allergies.   Review of Systems Review of Systems  Respiratory: Negative for shortness of breath.   Cardiovascular: Negative for chest pain.  Gastrointestinal: Negative for abdominal pain.  Musculoskeletal:       Right wrist deformity   Skin: Negative for wound.  Allergic/Immunologic: Negative for immunocompromised state.   Neurological: Negative for headaches.  Hematological: Does not bruise/bleed easily.  Psychiatric/Behavioral: Negative for confusion.  All other systems reviewed and are negative.    Physical Exam Updated Vital Signs BP 136/76   Pulse 85   Temp 98 F (36.7 C) (Oral)   Resp (!) 24   Ht  (1.549 m)   Wt 175 lb (79.4 kg)   SpO2 100%   BMI 33.07 kg/m   Physical Exam Physical Exam  Nursing note and vitals reviewed. Constitutional: Well developed, well nourished, non-toxic, and in no acute distress Head: Normocephalic and atraumatic.  Mouth/Throat: Oropharynx is clear and moist.  Neck: Normal range of motion. Neck supple.  Cardiovascular: Normal rate and regular rhythm.   Pulmonary/Chest: Effort normal and breath sounds normal.  Abdominal: Soft. There is no tenderness. There is no rebound and no guarding.  Musculoskeletal: deformity of the right wrist. +2 radial pulses. Sensation to light touch over radial, ulnar, median nerves. Unable to ROM fingers of the hand.  Neurological: Alert, no facial droop, fluent speech, moves all extremities symmetrically Skin: Skin is warm and dry.  Psychiatric: Cooperative   ED Treatments / Results  Labs (all labs ordered are listed, but only abnormal results are displayed) Labs Reviewed - No data to display  EKG  EKG Interpretation None       Radiology Dg Wrist Complete Right  Result Date: 10/24/2016 CLINICAL DATA:  Post reduction RIGHT wrist fracture EXAM: RIGHT WRIST - COMPLETE 3+ VIEW COMPARISON:  Earlier study of 10/24/2016 FINDINGS: Fiberglass splint material obscures bone detail. Minimally displaced ulnar styloid fracture unchanged. Improved alignment of comminuted displaced intra-articular distal LEFT radial metaphyseal fracture since previous exam. No additional fracture dislocation identified. IMPRESSION: Improved alignment of distal radial metaphyseal fracture since previous study post reduction and splinting. Mildly  displaced ulnar styloid fracture. Electronically Signed   By: Ulyses Southward M.D.   On: 10/24/2016 17:17   Dg Wrist Complete Right  Result Date: 10/24/2016 CLINICAL DATA:  Tripped ON root at construction site, falling on the hand. EXAM: RIGHT WRIST - COMPLETE 3+ VIEW COMPARISON:  None. FINDINGS: Comminuted complete transverse fracture of the distal radial metaphysis with complete full are dislocation of the main distal fracture fragment. Fracture lines do involved with the articular surface. There is also slight radial displacement of the main distal fracture fragment. Fracture of the ulnar styloid displaced 3 mm. Carpal bones move with the main distal radial fragment. IMPRESSION: Comminuted displaced fracture of the distal radius with intra-articular extension. Mildly displaced ulnar styloid fracture. Electronically Signed   By: Paulina Fusi M.D.   On: 10/24/2016 14:05    Procedures Procedures (including critical care time) Reduction of dislocation Date/Time: 6:30 PM Performed by: Lavera Guise Authorized by: Lavera Guise Consent: Verbal consent obtained. Risks and benefits: risks, benefits and alternatives were discussed Consent given by: patient Required items: required  blood products, implants, devices, and special equipment available Time out: Immediately prior to procedure a "time out" was called to verify the correct patient, procedure, equipment, support staff and site/side marked as required.  Patient sedated: propofol  Vitals: Vital signs were monitored during sedation. Patient tolerance: Patient tolerated the procedure well with no immediate complications. Joint: right wrist Reduction technique: traction  SPLINT APPLICATION Date/Time: 6:30 PM Authorized by: Lavera Guise Consent: Verbal consent obtained. Risks and benefits: risks, benefits and alternatives were discussed Consent given by: patient Splint applied by: nurse Location details: right wrist Splint type: orthoglass  sugartong Supplies used: orthoglasss Post-procedure: The splinted body part was neurovascularly unchanged following the procedure. Patient tolerance: Patient tolerated the procedure well with no immediate complications.  Procedural sedation Performed by: Lavera Guise Consent: Verbal consent obtained. Risks and benefits: risks, benefits and alternatives were discussed Required items: required blood products, implants, devices, and special equipment available Patient identity confirmed: arm band and provided demographic data Time out: Immediately prior to procedure a "time out" was called to verify the correct patient, procedure, equipment, support staff and site/side marked as required.  Sedation type: moderate (conscious) sedation NPO time confirmed and considedered  Sedatives: PROPOFOL  Physician Time at Bedside: 35 minutes  Vitals: Vital signs were monitored during sedation. Cardiac Monitor, pulse oximeter Patient tolerance: Patient tolerated the procedure well with no immediate complications. Comments: Pt with uneventful recovered. Returned to pre-procedural sedation baseline      Medications Ordered in ED Medications  fentaNYL (SUBLIMAZE) injection 50 mcg (50 mcg Intravenous Given 10/24/16 1447)  propofol (DIPRIVAN) 10 mg/mL bolus/IV push 79.4 mg (79.4 mg Intravenous Given 10/24/16 1632)  fentaNYL (SUBLIMAZE) injection 50 mcg (50 mcg Intravenous Given by Other 10/24/16 1644)  propofol (DIPRIVAN) 10 mg/mL bolus/IV push (35 mg Intravenous Given 10/24/16 1636)     Initial Impression / Assessment and Plan / ED Course  I have reviewed the triage vital signs and the nursing notes.  Pertinent labs & imaging results that were available during my care of the patient were reviewed by me and considered in my medical decision making (see chart for details).     Presenting with fall on outstretched hand, and sustaining closed fracture of the right distal radius with significant  displacement. X-rays visualized. Fracture is reduced at bedside under sedation, with intact neurovascular exam and improved alignment on repeat x-ray visualization. Discussed with Dr. Romeo Apple who will follow-up with patient at 10 AM tomorrow morning. Strict return and follow-up instructions reviewed. She expressed understanding of all discharge instructions and felt comfortable with the plan of care. Prior to discharge, patient awake, tolerating PO, and at mental status baseline.   Final Clinical Impressions(s) / ED Diagnoses   Final diagnoses:  Closed Smith's fracture of right radius, initial encounter  Closed displaced fracture of styloid process of right ulna with delayed healing, subsequent encounter    New Prescriptions New Prescriptions   OXYCODONE-ACETAMINOPHEN (PERCOCET/ROXICET) 5-325 MG TABLET    Take 1-2 tablets by mouth every 4 (four) hours as needed for moderate pain or severe pain.     Lavera Guise, MD 10/24/16 [phone]

## 2016-10-24 NOTE — Discharge Instructions (Signed)
You have a broken wrist. Please see Dr. Romeo Apple in the office tomorrow morning at 10 AM. Keep your arm elevated on 2 pillows at rest and ice as needed to keep swelling down.  Take pain medications as prescribed.  Return without fail for worsening symptoms, including escalating pain, numbness or weakness to the hand, skin discoloration, or any other symptoms concerning to you

## 2016-10-24 NOTE — ED Notes (Signed)
Pt given water per Dr. Verdie Mosher.

## 2016-10-24 NOTE — ED Notes (Addendum)
Pt made aware to return if symptoms worsen or if any life threatening symptoms occur.  Pt alert, cardiac rhythm wnl, pt elects to ambulate out of ED.  Pt told to return if problems arise with splint. Pt told to loosen ace wrap should fingers turn blue, cold, or become numb and return to ED if loosening ace wrap gives no relief.  Pt is ambulatory at discharge with no complaints.

## 2016-10-24 NOTE — ED Notes (Signed)
Dr. Verdie Mosher reviewed application of splint and is okay with going home.

## 2016-10-24 NOTE — ED Triage Notes (Signed)
Fell and injury to right wrist.  Visible deformity

## 2016-10-24 NOTE — ED Notes (Addendum)
Pt husband, nurse and MD signed consent for procedural sedation and reduction of right wrist. Husband signed for pt due to broken bone in right wrist. Pt verbalizes understanding of risks and benefits of procedures.  Pt placed on 12 lead cardiac monitor, C02 monitor, and on 2L 02 [place].  Pt alert and oriented at this time.

## 2016-10-24 NOTE — ED Notes (Signed)
Re-wrapped ace wrap around splint due to dark color of fingertips.  Pt fingertips now pink, sensation and cap refill are still intact.

## 2016-10-24 NOTE — Sedation Documentation (Signed)
Medication dose calculated and verified for propofol, verified with Dr. Verdie Mosher and Dyanne Iha, RN.

## 2016-10-25 ENCOUNTER — Encounter: Payer: Self-pay | Admitting: Orthopedic Surgery

## 2016-10-25 ENCOUNTER — Ambulatory Visit (INDEPENDENT_AMBULATORY_CARE_PROVIDER_SITE_OTHER): Payer: Worker's Compensation | Admitting: Orthopedic Surgery

## 2016-10-25 ENCOUNTER — Telehealth: Payer: Self-pay | Admitting: Orthopedic Surgery

## 2016-10-25 VITALS — BP 127/84 | HR 77 | Ht 61.5 in | Wt 176.0 lb

## 2016-10-25 DIAGNOSIS — S52571A Other intraarticular fracture of lower end of right radius, initial encounter for closed fracture: Secondary | ICD-10-CM | POA: Diagnosis not present

## 2016-10-25 MED ORDER — PROMETHAZINE HCL 12.5 MG PO TABS: 12.5000 mg | ORAL_TABLET | ORAL | 2 refills | Status: DC | PRN

## 2016-10-25 MED ORDER — HYDROCODONE-ACETAMINOPHEN 7.5-325 MG PO TABS: 1.0000 | ORAL_TABLET | ORAL | 0 refills | Status: DC | PRN

## 2016-10-25 NOTE — Progress Notes (Signed)
New fracture new patient   Chief Complaint  Patient presents with  . 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    Review of Systems  Respiratory: Negative for shortness of breath.   Cardiovascular: Negative for chest pain.   Past Medical History:  Diagnosis Date  . Essential hypertension   . GERD (gastroesophageal reflux disease)   . History of stress test 06/29/2008   Normal Myocardial perfusion imaging, No significant ischemia demonstrated this is a low risk scan, No prior study availiable for comparison  . Obesity   . Paroxysmal atrial fibrillation (HCC)   . PVC's (premature ventricular contractions)    a. rare PVCs on event monitor in 2017.  Marland Kitchen Sleep apnea 01/23/2011   AHI during sleep (4h ) was 0.0/hr during REMM sleep 0.0/hr, RDI during sleep (4h54min) was 12.0/hr during REM sleep 1.7/hr  . Type 2 diabetes mellitus (HCC)     Past Medical History:  Diagnosis Date  . Essential hypertension   . GERD (gastroesophageal reflux disease)   . History of stress test 06/29/2008   Normal Myocardial perfusion imaging, No significant ischemia demonstrated this is a low risk scan, No prior study availiable for comparison  . Obesity   . Paroxysmal atrial fibrillation (HCC)   . PVC's (premature  ventricular contractions)    a. rare PVCs on event monitor in 2017.  Marland Kitchen Sleep apnea 01/23/2011   AHI during sleep (4h ) was 0.0/hr during REMM sleep 0.0/hr, RDI during sleep (4h17min) was 12.0/hr during REM sleep 1.7/hr  . Type 2 diabetes mellitus (HCC)     BP 127/84   Pulse 77   Ht 5' 1.5" (1.562 m)   Wt 176 lb (79.8 kg)   BMI 32.72 kg/m  General appearance of the patient was normal in terms of nutritional status grooming hygiene body habitus is endomorphic normal development  Cardiovascular exam showed normal capillary refill and color to the right hand with minimal swelling of the digits  Lymph nodes in the epitrochlear region were normal  She walks without assistive devices there is no limp  The right arm remained in a splint I did not remove the splint to save the reduction. She had good sensation to all of her digits on the right hand  Skin was visible in the hand was normal  She was oriented to person place and time mood and affect were normal  She did fall onto the right knee as well there was an abrasion over the right knee she had a previous ORIF of what looks like a tibial plateau fracture. This did not affect her range of motion there was no joint contracture the limb alignment was normal and the ligaments were stable and she had normal muscle strength and tone.   Pre-and post reduction x-rays show a volarly displaced intra-articular fracture of the right distal  radius with metaphyseal extension which was reduced well with some residual coronal plane malalignment  Encounter Diagnosis  Name Primary?  . Other closed intra-articular fracture of distal end of right radius, initial encounter Yes    Based on the complex nature of this particular fracture we have sent her to a hand specialist for definitive care. I changed her from Percocet to Norco and added Phenergan  Meds ordered this encounter  Medications  . HYDROcodone-acetaminophen (NORCO) 7.5-325 MG tablet     Sig: Take 1 tablet by mouth every 4 (four) hours as needed for moderate pain.    Dispense:  30 tablet    Refill:  0  . promethazine (PHENERGAN) 12.5 MG tablet    Sig: Take 1 tablet (12.5 mg total) by mouth every 4 (four) hours as needed for nausea or vomiting.    Dispense:  60 tablet    Refill:  2

## 2016-10-25 NOTE — Telephone Encounter (Signed)
Per patient's Emergency Room visit at [facility] for problem of distal radius fracture, patient was referred to Dr Romeo Apple (on orthopaedic call for hospital).  Patient was seen today, 10/25/16, 10:00a.m.by Dr Romeo Apple.  Referral has been made to Dr Roda Shutters, [facility] for further evaluation and treatment. Appointment has been scheduled for tomorrow, 8:30a.m.  Pt aware. Came to attention that [employer] is the payor.  CL# [id number] / DOI 10/24/16 Contacted adjuster, just assigned, Iona Coach, notified of appointment information via voice message.  Patient aware of status.  Research officer, political party, Jory Sims of both [employer] and [employer], also aware.

## 2016-10-25 NOTE — Patient Instructions (Addendum)
Wrist Fracture Treated With ORIF A wrist fracture is a break or crack in one of the bones of your wrist. Your wrist is made up of eight small bones at the palm of your hand (carpal bones) and two long bones that make up your forearm (radius and ulna). If your fracture is displaced, that means that one or more parts of a bone have been moved out of normal position and the normal alignment between bones is destroyed. This type of fracture is treated with a surgical procedure that is called open reduction with internal fixation (ORIF). In this procedure, the bone pieces are put back together, and they are held in place by plates, screws, or other types of hardware. The procedure helps the bones to heal properly. Tell a health care provider about:  Any allergies you have.  All medicines you are taking, including vitamins, herbs, eye drops, creams, and over-the-counter medicines.  Any problems you or family members have had with anesthetic medicines.  Any blood disorders you have.  Any surgeries you have had.  Any medical conditions you have.  Whether you are pregnant or may be pregnant. What are the risks? Generally, this is a safe procedure. However, problems may occur, including:  Infection.  Bleeding.  Allergic reactions to medicines.  Damage to other structures or organs. What happens before the procedure?  Follow instructions from your health care provider about eating or drinking restrictions.  Ask your health care provider about:  Changing or stopping your regular medicines. This is especially important if you are taking diabetes medicines or blood thinners.  Taking medicines such as aspirin and ibuprofen. These medicines can thin your blood. Do not take these medicines before your procedure if your health care provider instructs you not to.  Plan to have someone take you home after the procedure.  If you will be going home right after the procedure, plan to have someone  with you for 24 hours.  Ask your health care provider how your surgical site will be marked or identified.  You may be given antibiotic medicine to help prevent infection. What happens during the procedure?  To reduce your risk of infection:  Your health care team will wash or sanitize their hands.  Your skin will be washed with soap.  An IV tube will be inserted into one of your veins.  You will be given one or more of the following:  A medicine to help you relax (sedative).  A medicine to numb the area (local anesthetic).  A medicine to make you fall asleep (general anesthetic).  A medicine that is injected into an area of your body to numb everything below the injection site (regional anesthetic).  The surgeon will make an incision through your skin to expose the areas of the fracture.  The broken bones will be returned to their normal positions. To hold the bones in place, the surgeon will use screws and a metal plate or different types of wiring.  The surgeon will close the incision with stitches (sutures) or staples.  A bandage (dressing) will be placed over your incision. The procedure may vary among health care providers and hospitals. What happens after the procedure?  Your blood pressure, heart rate, breathing rate, and blood oxygen level will be monitored often until the medicines you were given have worn off.  You will be given medicine as needed for pain.  Do not drive for 24 hours if you received a sedative.  You may have physical  therapy while you are in the hospital. This information is not intended to replace advice given to you by your health care provider. Make sure you discuss any questions you have with your health care provider. Document Released: 06/14/2015 Document Revised: 12/09/2015 Document Reviewed: 03/17/2015 Elsevier Interactive Patient Education  2017 [employer].

## 2016-10-26 ENCOUNTER — Ambulatory Visit (INDEPENDENT_AMBULATORY_CARE_PROVIDER_SITE_OTHER): Payer: Worker's Compensation | Admitting: Orthopaedic Surgery

## 2016-10-26 ENCOUNTER — Encounter (INDEPENDENT_AMBULATORY_CARE_PROVIDER_SITE_OTHER): Payer: Self-pay | Admitting: Orthopaedic Surgery

## 2016-10-26 DIAGNOSIS — M25561 Pain in right knee: Secondary | ICD-10-CM | POA: Diagnosis not present

## 2016-10-26 DIAGNOSIS — S52341A Displaced spiral fracture of shaft of radius, right arm, initial encounter for closed fracture: Secondary | ICD-10-CM

## 2016-10-26 DIAGNOSIS — S52611A Displaced fracture of right ulna styloid process, initial encounter for closed fracture: Secondary | ICD-10-CM

## 2016-10-26 DIAGNOSIS — G8929 Other chronic pain: Secondary | ICD-10-CM

## 2016-10-26 MED ORDER — DICLOFENAC SODIUM 1 % TD GEL: 2.0000 g | Freq: Four times a day (QID) | TRANSDERMAL | 5 refills | Status: DC

## 2016-10-26 NOTE — Progress Notes (Signed)
Office Visit Note   Patient: Penny Byrd           Date of Birth: 1961-04-10           MRN: [id number] Visit Date: 10/26/2016              Requested by: Benita Stabile, MD [address] [place], [place] [address] PCP: Dwana Melena, MD   Assessment & Plan: Visit Diagnoses:  1. Chronic pain of right knee     Plan: I reviewed the x-rays with the patient and recommended surgical treatment for her highly unstable injuries. We discussed the risks benefits alternatives to surgery and she understands and wishes to proceed. She does have too much swelling to safely do the surgery in the next few days. Therefore I would wait until next Wednesday to do the surgery. My plan is to fix the distal radius and then evaluate the ulnar styloid fracture for possible fixation. Questions encouraged and answered.  Follow-Up Instructions: Return for 2 week postop.   Orders:  No orders of the defined types were placed in this encounter.  Meds ordered this encounter  Medications  . DISCONTD: diclofenac sodium (VOLTAREN) 1 % GEL    Sig: Apply 2 g topically 4 (four) times daily.    Dispense:  1 Tube    Refill:  5      Procedures: No procedures performed   Clinical Data: No additional findings.   Subjective: Chief Complaint  Patient presents with  . Right Wrist - Pain    Penny Byrd is a very pleasant 56 year old female who had a mechanical fall onto her right hand on Tuesday and was evaluated in the [facility] ER. She was referred here for surgical consideration area she tripped over a tree root.  She is right-hand dominant and reports no signs or symptoms of carpal tunnel syndrome. She does still have pain which she has been taking hydrocodone for with Phenergan. She does have swelling and is wiggling her fingers. She takes a full strength aspirin daily. She denies any radiation of pain.    Review of Systems  Constitutional: Negative.   HENT: Negative.   Eyes: Negative.   Respiratory:  Negative.   Cardiovascular: Negative.   Endocrine: Negative.   Musculoskeletal: Negative.   Neurological: Negative.   Hematological: Negative.   Psychiatric/Behavioral: Negative.   All other systems reviewed and are negative.    Objective: Vital Signs: There were no vitals taken for this visit.  Physical Exam  Constitutional: She is oriented to person, place, and time. She appears well-developed and well-nourished.  HENT:  Head: Normocephalic and atraumatic.  Eyes: EOM are normal.  Neck: Neck supple.  Pulmonary/Chest: Effort normal.  Abdominal: Soft.  Neurological: She is alert and oriented to person, place, and time.  Skin: Skin is warm. Capillary refill takes less than 2 seconds.  Psychiatric: She has a normal mood and affect. Her behavior is normal. Judgment and thought content normal.  Nursing note and vitals reviewed.   Ortho Exam Right wrist exam shows severe swelling and bruising. Her fingers are warm and well perfused. No evidence of carpal tunnel syndrome. Specialty Comments:  No specialty comments available.  Imaging: No results found.   PMFS History: Patient Active Problem List   Diagnosis Date Noted  . Obesity (BMI 30.0-34.9) 02/04/2013  . Dyslipidemia 02/04/2013  . Essential hypertension 02/04/2013  . PFO (patent foramen ovale) 02/04/2013  . Palpitations 02/04/2013  . PAF (paroxysmal atrial fibrillation) (HCC) 02/04/2013  .  Swelling of skeletal muscle 10/10/2011  . Pain in thoracic spine 10/10/2011   Past Medical History:  Diagnosis Date  . Essential hypertension   . GERD (gastroesophageal reflux disease)   . History of stress test 06/29/2008   Normal Myocardial perfusion imaging, No significant ischemia demonstrated this is a low risk scan, No prior study availiable for comparison  . Obesity   . Paroxysmal atrial fibrillation (HCC)   . PVC's (premature ventricular contractions)    a. rare PVCs on event monitor in 2017.  Marland Kitchen Sleep apnea 01/23/2011    AHI during sleep (4h ) was 0.0/hr during REMM sleep 0.0/hr, RDI during sleep (4h75min) was 12.0/hr during REM sleep 1.7/hr  . Type 2 diabetes mellitus (HCC)     Family History  Problem Relation Age of Onset  . Heart failure Mother   . Cancer Father   . Heart disease Father   . Healthy Sister   . Hypertension Brother     Past Surgical History:  Procedure Laterality Date  . ABDOMINAL HYSTERECTOMY  age 45  . BREAST LUMPECTOMY Right 04/1998   Dr Precious Reel, Benign Right breast disease  . TONSILLECTOMY  2000   Social History   Occupational History  . Not on file.   Social History Main Topics  . Smoking status: Never Smoker  . Smokeless tobacco: Never Used  . Alcohol use No  . Drug use: Unknown  . Sexual activity: Not on file

## 2016-10-27 ENCOUNTER — Encounter (HOSPITAL_BASED_OUTPATIENT_CLINIC_OR_DEPARTMENT_OTHER): Payer: Self-pay | Admitting: *Deleted

## 2016-10-27 ENCOUNTER — Other Ambulatory Visit (INDEPENDENT_AMBULATORY_CARE_PROVIDER_SITE_OTHER): Payer: Self-pay | Admitting: Orthopaedic Surgery

## 2016-10-27 DIAGNOSIS — S52501A Unspecified fracture of the lower end of right radius, initial encounter for closed fracture: Secondary | ICD-10-CM

## 2016-10-27 DIAGNOSIS — S52601A Unspecified fracture of lower end of right ulna, initial encounter for closed fracture: Principal | ICD-10-CM

## 2016-11-01 ENCOUNTER — Ambulatory Visit (HOSPITAL_BASED_OUTPATIENT_CLINIC_OR_DEPARTMENT_OTHER): Payer: Worker's Compensation | Admitting: Anesthesiology

## 2016-11-01 ENCOUNTER — Encounter (HOSPITAL_BASED_OUTPATIENT_CLINIC_OR_DEPARTMENT_OTHER): Payer: Self-pay

## 2016-11-01 ENCOUNTER — Ambulatory Visit (HOSPITAL_BASED_OUTPATIENT_CLINIC_OR_DEPARTMENT_OTHER)
Admission: RE | Admit: 2016-11-01 | Discharge: 2016-11-01 | Disposition: A | Discharge status: Home / Self Care | Payer: Worker's Compensation | Source: Ambulatory Visit | Attending: Orthopaedic Surgery | Admitting: Orthopaedic Surgery

## 2016-11-01 ENCOUNTER — Encounter (HOSPITAL_BASED_OUTPATIENT_CLINIC_OR_DEPARTMENT_OTHER)
Admission: RE | Disposition: A | Discharge status: Home / Self Care | Payer: Self-pay | Source: Ambulatory Visit | Attending: Orthopaedic Surgery

## 2016-11-01 DIAGNOSIS — Z8249 Family history of ischemic heart disease and other diseases of the circulatory system: Secondary | ICD-10-CM | POA: Insufficient documentation

## 2016-11-01 DIAGNOSIS — M19041 Primary osteoarthritis, right hand: Secondary | ICD-10-CM | POA: Insufficient documentation

## 2016-11-01 DIAGNOSIS — S52551A Other extraarticular fracture of lower end of right radius, initial encounter for closed fracture: Secondary | ICD-10-CM | POA: Insufficient documentation

## 2016-11-01 DIAGNOSIS — E669 Obesity, unspecified: Secondary | ICD-10-CM | POA: Insufficient documentation

## 2016-11-01 DIAGNOSIS — I493 Ventricular premature depolarization: Secondary | ICD-10-CM | POA: Diagnosis not present

## 2016-11-01 DIAGNOSIS — S52501A Unspecified fracture of the lower end of right radius, initial encounter for closed fracture: Secondary | ICD-10-CM | POA: Diagnosis present

## 2016-11-01 DIAGNOSIS — M1990 Unspecified osteoarthritis, unspecified site: Secondary | ICD-10-CM | POA: Diagnosis not present

## 2016-11-01 DIAGNOSIS — I10 Essential (primary) hypertension: Secondary | ICD-10-CM | POA: Insufficient documentation

## 2016-11-01 DIAGNOSIS — Z7982 Long term (current) use of aspirin: Secondary | ICD-10-CM | POA: Insufficient documentation

## 2016-11-01 DIAGNOSIS — I480 Paroxysmal atrial fibrillation: Secondary | ICD-10-CM | POA: Diagnosis not present

## 2016-11-01 DIAGNOSIS — Z79899 Other long term (current) drug therapy: Secondary | ICD-10-CM | POA: Diagnosis not present

## 2016-11-01 DIAGNOSIS — W010XXA Fall on same level from slipping, tripping and stumbling without subsequent striking against object, initial encounter: Secondary | ICD-10-CM | POA: Diagnosis not present

## 2016-11-01 DIAGNOSIS — Z809 Family history of malignant neoplasm, unspecified: Secondary | ICD-10-CM | POA: Diagnosis not present

## 2016-11-01 DIAGNOSIS — Z6833 Body mass index (BMI) 33.0-33.9, adult: Secondary | ICD-10-CM | POA: Insufficient documentation

## 2016-11-01 DIAGNOSIS — Y939 Activity, unspecified: Secondary | ICD-10-CM | POA: Insufficient documentation

## 2016-11-01 DIAGNOSIS — S52611A Displaced fracture of right ulna styloid process, initial encounter for closed fracture: Secondary | ICD-10-CM | POA: Insufficient documentation

## 2016-11-01 DIAGNOSIS — K219 Gastro-esophageal reflux disease without esophagitis: Secondary | ICD-10-CM | POA: Diagnosis not present

## 2016-11-01 DIAGNOSIS — Y9289 Other specified places as the place of occurrence of the external cause: Secondary | ICD-10-CM | POA: Diagnosis not present

## 2016-11-01 DIAGNOSIS — M19042 Primary osteoarthritis, left hand: Secondary | ICD-10-CM | POA: Diagnosis not present

## 2016-11-01 DIAGNOSIS — S52601A Unspecified fracture of lower end of right ulna, initial encounter for closed fracture: Secondary | ICD-10-CM

## 2016-11-01 DIAGNOSIS — Z90710 Acquired absence of both cervix and uterus: Secondary | ICD-10-CM | POA: Diagnosis not present

## 2016-11-01 HISTORY — DX: Unspecified fracture of the lower end of right radius, initial encounter for closed fracture: S52.501A

## 2016-11-01 HISTORY — PX: OPEN REDUCTION INTERNAL FIXATION (ORIF) DISTAL RADIAL FRACTURE: SHX5989

## 2016-11-01 HISTORY — DX: Unspecified osteoarthritis, unspecified site: M19.90

## 2016-11-01 SURGERY — OPEN REDUCTION INTERNAL FIXATION (ORIF) DISTAL RADIUS FRACTURE
Anesthesia: Monitor Anesthesia Care | Site: Arm Lower | Laterality: Right

## 2016-11-01 MED ORDER — OXYCODONE HCL 5 MG PO TABS: 5.0000 mg | ORAL_TABLET | Freq: Once | ORAL | Status: DC | PRN

## 2016-11-01 MED ORDER — OXYCODONE HCL 5 MG PO TABS: 5.0000 mg | ORAL_TABLET | ORAL | 0 refills | Status: DC | PRN

## 2016-11-01 MED ORDER — PROPOFOL 500 MG/50ML IV EMUL
INTRAVENOUS | Status: AC
  Filled 2016-11-01: qty 50

## 2016-11-01 MED ORDER — FENTANYL CITRATE (PF) 100 MCG/2ML IJ SOLN
INTRAMUSCULAR | Status: AC
  Filled 2016-11-01: qty 2

## 2016-11-01 MED ORDER — SENNOSIDES-DOCUSATE SODIUM 8.6-50 MG PO TABS: 1.0000 | ORAL_TABLET | Freq: Every evening | ORAL | 1 refills | Status: DC | PRN

## 2016-11-01 MED ORDER — MEPERIDINE HCL 25 MG/ML IJ SOLN: 6.2500 mg | INTRAMUSCULAR | Status: DC | PRN

## 2016-11-01 MED ORDER — SCOPOLAMINE 1 MG/3DAYS TD PT72: 1.0000 | MEDICATED_PATCH | Freq: Once | TRANSDERMAL | Status: DC | PRN

## 2016-11-01 MED ORDER — LACTATED RINGERS IV SOLN
INTRAVENOUS | Status: DC
  Administered 2016-11-01 (×2): via INTRAVENOUS

## 2016-11-01 MED ORDER — ONDANSETRON HCL 4 MG/2ML IJ SOLN
INTRAMUSCULAR | Status: AC
  Filled 2016-11-01: qty 2

## 2016-11-01 MED ORDER — CEFAZOLIN SODIUM-DEXTROSE 2-4 GM/100ML-% IV SOLN
2.0000 g | INTRAVENOUS | Status: AC
  Administered 2016-11-01: 2 g via INTRAVENOUS

## 2016-11-01 MED ORDER — OXYCODONE HCL ER 10 MG PO T12A: 10.0000 mg | EXTENDED_RELEASE_TABLET | Freq: Two times a day (BID) | ORAL | 0 refills | Status: DC

## 2016-11-01 MED ORDER — MIDAZOLAM HCL 2 MG/2ML IJ SOLN
INTRAMUSCULAR | Status: AC
Start: 2016-11-01 — End: 2016-11-01
  Filled 2016-11-01: qty 2

## 2016-11-01 MED ORDER — MIDAZOLAM HCL 2 MG/2ML IJ SOLN
1.0000 mg | INTRAMUSCULAR | Status: DC | PRN
  Administered 2016-11-01 (×2): 1 mg via INTRAVENOUS
  Administered 2016-11-01: 2 mg via INTRAVENOUS

## 2016-11-01 MED ORDER — LIDOCAINE HCL (PF) 2 % IJ SOLN
INTRAMUSCULAR | Status: DC | PRN
  Administered 2016-11-01: 15 mL via INTRADERMAL

## 2016-11-01 MED ORDER — BUPIVACAINE-EPINEPHRINE (PF) 0.5% -1:200000 IJ SOLN
INTRAMUSCULAR | Status: DC | PRN
  Administered 2016-11-01: 25 mL via PERINEURAL

## 2016-11-01 MED ORDER — OXYCODONE HCL 5 MG/5ML PO SOLN: 5.0000 mg | Freq: Once | ORAL | Status: DC | PRN

## 2016-11-01 MED ORDER — KETOROLAC TROMETHAMINE 30 MG/ML IJ SOLN: 30.0000 mg | Freq: Once | INTRAMUSCULAR | Status: DC | PRN

## 2016-11-01 MED ORDER — ONDANSETRON HCL 4 MG PO TABS: 4.0000 mg | ORAL_TABLET | Freq: Three times a day (TID) | ORAL | 0 refills | Status: DC | PRN

## 2016-11-01 MED ORDER — FENTANYL CITRATE (PF) 100 MCG/2ML IJ SOLN
50.0000 ug | INTRAMUSCULAR | Status: DC | PRN
  Administered 2016-11-01 (×2): 50 ug via INTRAVENOUS

## 2016-11-01 MED ORDER — PROMETHAZINE HCL 25 MG/ML IJ SOLN: 6.2500 mg | INTRAMUSCULAR | Status: DC | PRN

## 2016-11-01 MED ORDER — PROPOFOL 500 MG/50ML IV EMUL
INTRAVENOUS | Status: DC | PRN
  Administered 2016-11-01: 50 ug/kg/min via INTRAVENOUS

## 2016-11-01 MED ORDER — LIDOCAINE 2% (20 MG/ML) 5 ML SYRINGE
INTRAMUSCULAR | Status: AC
  Filled 2016-11-01: qty 5

## 2016-11-01 MED ORDER — METHOCARBAMOL 750 MG PO TABS: 750.0000 mg | ORAL_TABLET | Freq: Two times a day (BID) | ORAL | 0 refills | Status: DC | PRN

## 2016-11-01 MED ORDER — HYDROMORPHONE HCL 1 MG/ML IJ SOLN: 0.2500 mg | INTRAMUSCULAR | Status: DC | PRN

## 2016-11-01 MED ORDER — HYDROMORPHONE HCL 2 MG PO TABS: 2.0000 mg | ORAL_TABLET | ORAL | 0 refills | Status: DC | PRN

## 2016-11-01 MED ORDER — ONDANSETRON HCL 4 MG/2ML IJ SOLN
INTRAMUSCULAR | Status: DC | PRN
  Administered 2016-11-01: 4 mg via INTRAVENOUS

## 2016-11-01 MED ORDER — CEFAZOLIN SODIUM-DEXTROSE 2-4 GM/100ML-% IV SOLN
INTRAVENOUS | Status: AC
  Filled 2016-11-01: qty 100

## 2016-11-01 SURGICAL SUPPLY — 80 items
BANDAGE ACE 3X5.8 VEL STRL LF (GAUZE/BANDAGES/DRESSINGS) ×2 IMPLANT
BANDAGE ACE 4X5 VEL STRL LF (GAUZE/BANDAGES/DRESSINGS) IMPLANT
BIT DRILL 2.2 SS TIBIAL (BIT) ×1 IMPLANT
BLADE MINI RND TIP GREEN BEAV (BLADE) IMPLANT
BLADE SURG 15 STRL LF DISP TIS (BLADE) ×2 IMPLANT
BLADE SURG 15 STRL SS (BLADE) ×4
BNDG CMPR 9X4 STRL LF SNTH (GAUZE/BANDAGES/DRESSINGS) ×1
BNDG ESMARK 4X9 LF (GAUZE/BANDAGES/DRESSINGS) ×2 IMPLANT
BRUSH SCRUB EZ PLAIN DRY (MISCELLANEOUS) ×2 IMPLANT
CORDS BIPOLAR (ELECTRODE) ×2 IMPLANT
COVER BACK TABLE 60X90IN (DRAPES) ×2 IMPLANT
COVER MAYO STAND STRL (DRAPES) ×2 IMPLANT
CUFF TOURNIQUET SINGLE 18IN (TOURNIQUET CUFF) ×1 IMPLANT
DECANTER SPIKE VIAL GLASS SM (MISCELLANEOUS) IMPLANT
DRAPE EXTREMITY T 121X128X90 (DRAPE) ×2 IMPLANT
DRAPE OEC MINIVIEW 54X84 (DRAPES) ×2 IMPLANT
DRAPE SURG 17X23 STRL (DRAPES) ×2 IMPLANT
GAUZE SPONGE 4X4 12PLY STRL (GAUZE/BANDAGES/DRESSINGS) ×2 IMPLANT
GAUZE SPONGE 4X4 16PLY XRAY LF (GAUZE/BANDAGES/DRESSINGS) IMPLANT
GAUZE XEROFORM 1X8 LF (GAUZE/BANDAGES/DRESSINGS) ×2 IMPLANT
GLOVE BIOGEL PI IND STRL 7.0 (GLOVE) IMPLANT
GLOVE BIOGEL PI IND STRL 8 (GLOVE) ×1 IMPLANT
GLOVE BIOGEL PI INDICATOR 7.0 (GLOVE) ×1
GLOVE BIOGEL PI INDICATOR 8 (GLOVE) ×1
GLOVE ECLIPSE 6.5 STRL STRAW (GLOVE) ×1 IMPLANT
GLOVE SKINSENSE NS SZ7.5 (GLOVE) ×1
GLOVE SKINSENSE STRL SZ7.5 (GLOVE) ×1 IMPLANT
GLOVE SURG SYN 7.5  E (GLOVE) ×1
GLOVE SURG SYN 7.5 E (GLOVE) ×1 IMPLANT
GLOVE SURG SYN 7.5 PF PI (GLOVE) ×1 IMPLANT
GOWN STRL REIN XL XLG (GOWN DISPOSABLE) ×2 IMPLANT
GOWN STRL REUS W/ TWL LRG LVL3 (GOWN DISPOSABLE) ×1 IMPLANT
GOWN STRL REUS W/TWL LRG LVL3 (GOWN DISPOSABLE) ×2
K-WIRE 1.6 (WIRE) ×4
K-WIRE FX5X1.6XNS BN SS (WIRE) ×2
KWIRE FX5X1.6XNS BN SS (WIRE) ×2 IMPLANT
NEEDLE HYPO 22GX1.5 SAFETY (NEEDLE) IMPLANT
NS IRRIG 1000ML POUR BTL (IV SOLUTION) ×2 IMPLANT
PACK BASIN DAY SURGERY FS (CUSTOM PROCEDURE TRAY) ×2 IMPLANT
PAD CAST 3X4 CTTN HI CHSV (CAST SUPPLIES) ×1 IMPLANT
PADDING CAST ABS 3INX4YD NS (CAST SUPPLIES)
PADDING CAST ABS COTTON 3X4 (CAST SUPPLIES) IMPLANT
PADDING CAST COTTON 3X4 STRL (CAST SUPPLIES) ×2
PADDING CAST SYNTHETIC 3 NS LF (CAST SUPPLIES)
PADDING CAST SYNTHETIC 3X4 NS (CAST SUPPLIES) IMPLANT
PADDING CAST SYNTHETIC 4 (CAST SUPPLIES)
PADDING CAST SYNTHETIC 4X4 STR (CAST SUPPLIES) IMPLANT
PLATE LONG DVR RIGHT (Plate) ×2 IMPLANT
RUBBERBAND STERILE (MISCELLANEOUS) ×2 IMPLANT
SCREW LOCK 10X2.7X3 LD THRD (Screw) IMPLANT
SCREW LOCK 12X2.7X 3 LD (Screw) IMPLANT
SCREW LOCK 14X2.7X 3 LD TPR (Screw) IMPLANT
SCREW LOCK 18X2.7X 3 LD TPR (Screw) IMPLANT
SCREW LOCK 20X2.7X 3 LD TPR (Screw) IMPLANT
SCREW LOCKING 2.7X10MM (Screw) ×2 IMPLANT
SCREW LOCKING 2.7X12MM (Screw) ×8 IMPLANT
SCREW LOCKING 2.7X14 (Screw) ×4 IMPLANT
SCREW LOCKING 2.7X18 (Screw) ×8 IMPLANT
SCREW LOCKING 2.7X20MM (Screw) ×2 IMPLANT
SCREW LP NL 2.7X22MM (Screw) ×1 IMPLANT
SLEEVE SCD COMPRESS KNEE MED (MISCELLANEOUS) ×2 IMPLANT
SLING ARM FOAM STRAP MED (SOFTGOODS) ×1 IMPLANT
SPLINT FIBERGLASS 3X35 (CAST SUPPLIES) IMPLANT
SPONGE LAP 18X18 X RAY DECT (DISPOSABLE) ×2 IMPLANT
STOCKINETTE 4X48 STRL (DRAPES) ×2 IMPLANT
SUCTION FRAZIER HANDLE 10FR (MISCELLANEOUS) ×1
SUCTION TUBE FRAZIER 10FR DISP (MISCELLANEOUS) ×1 IMPLANT
SUT ETHILON 4 0 PS 2 18 (SUTURE) ×2 IMPLANT
SUT VIC AB 2-0 SH 27 (SUTURE)
SUT VIC AB 2-0 SH 27XBRD (SUTURE) IMPLANT
SUT VIC AB 3-0 PS1 18 (SUTURE) ×2
SUT VIC AB 3-0 PS1 18XBRD (SUTURE) ×1 IMPLANT
SYR BULB 3OZ (MISCELLANEOUS) ×2 IMPLANT
SYR CONTROL 10ML LL (SYRINGE) IMPLANT
TOWEL OR 17X24 6PK STRL BLUE (TOWEL DISPOSABLE) ×4 IMPLANT
TOWEL OR NON WOVEN STRL DISP B (DISPOSABLE) ×2 IMPLANT
TRAY DSU PREP LF (CUSTOM PROCEDURE TRAY) ×2 IMPLANT
TUBE CONNECTING 20X1/4 (TUBING) ×2 IMPLANT
UNDERPAD 30X30 (UNDERPADS AND DIAPERS) ×2 IMPLANT
YANKAUER SUCT BULB TIP NO VENT (SUCTIONS) IMPLANT

## 2016-11-01 NOTE — H&P (Signed)
PREOPERATIVE H&P  Chief Complaint: right distal radius, ulna fractures  HPI: Penny Byrd is a 56 y.o. female who presents for surgical treatment of right distal radius, ulna fractures.  She denies any changes in medical history.  Past Medical History:  Diagnosis Date  . Arthritis    hands  . Distal radius fracture, right   . Essential hypertension   . GERD (gastroesophageal reflux disease)   . History of stress test 06/29/2008   Normal Myocardial perfusion imaging, No significant ischemia demonstrated this is a low risk scan, No prior study availiable for comparison  . Obesity   . Paroxysmal atrial fibrillation (HCC)   . PVC's (premature ventricular contractions)    a. rare PVCs on event monitor in 2017.   Past Surgical History:  Procedure Laterality Date  . ABDOMINAL HYSTERECTOMY  age 44  . BREAST LUMPECTOMY Right 04/1998   Dr Precious Reel, Benign Right breast disease  . PATELLA FRACTURE SURGERY     right  . TONSILLECTOMY  2000   Social History   Social History  . Marital status: Married    Spouse name: N/A  . Number of children: N/A  . Years of education: N/A   Social History Main Topics  . Smoking status: Never Smoker  . Smokeless tobacco: Never Used  . Alcohol use No  . Drug use: No  . Sexual activity: Not Asked   Other Topics Concern  . None   Social History Narrative  . None   Family History  Problem Relation Age of Onset  . Heart failure Mother   . Cancer Father   . Heart disease Father   . Healthy Sister   . Hypertension Brother    No Known Allergies Prior to Admission medications   Medication Sig Start Date End Date Taking? Authorizing Provider  aspirin EC 325 MG tablet Take 1 tablet (325 mg total) by mouth daily. 05/06/15  Yes Jodelle Gross, NP  estradiol (ESTRACE) 1 MG tablet Take 1 tablet by mouth daily. 04/11/14  Yes Historical Provider, MD  HYDROcodone-acetaminophen (NORCO) 7.5-325 MG tablet Take 1 tablet by mouth every 4  (four) hours as needed for moderate pain. 10/25/16  Yes Vickki Hearing, MD  metoprolol succinate (TOPROL-XL) 50 MG 24 hr tablet TAKE ONE TABLET BY MOUTH DAILY WITH OR IMMEDIATELY FOLLOWING SUPPER. 05/08/16  Yes Jodelle Gross, NP  Omega-3 Fatty Acids (FISH OIL) 1200 MG CAPS Take 1,200 mg by mouth daily.    Yes Historical Provider, MD  omeprazole (PRILOSEC) 20 MG capsule Take 20 mg by mouth daily.   Yes Historical Provider, MD  promethazine (PHENERGAN) 12.5 MG tablet Take 1 tablet (12.5 mg total) by mouth every 4 (four) hours as needed for nausea or vomiting. 10/25/16  Yes Vickki Hearing, MD  valsartan (DIOVAN) 80 MG tablet TAKE ONE (1) TABLET BY MOUTH EVERY DAY 05/21/15  Yes Leone Brand, NP     Positive ROS: All other systems have been reviewed and were otherwise negative with the exception of those mentioned in the HPI and as above.  Physical Exam: General: Alert, no acute distress Cardiovascular: No pedal edema Respiratory: No cyanosis, no use of accessory musculature GI: abdomen soft Skin: No lesions in the area of chief complaint Neurologic: Sensation intact distally Psychiatric: Patient is competent for consent with normal mood and affect Lymphatic: no lymphedema  MUSCULOSKELETAL: exam stable  Assessment: right distal radius, ulna fractures  Plan: Plan for Procedure(s): OPEN REDUCTION INTERNAL FIXATION (  ORIF) RIGHT DISTAL RADIUS AND ULNA FRACTURES  The risks benefits and alternatives were discussed with the patient including but not limited to the risks of nonoperative treatment, versus surgical intervention including infection, bleeding, nerve injury,  blood clots, cardiopulmonary complications, morbidity, mortality, among others, and they were willing to proceed.   Glee Arvin, MD   11/01/2016 8:23 AM

## 2016-11-01 NOTE — Progress Notes (Signed)
Assisted Dr. Germeroth with right, ultrasound guided, axillary block. Side rails up, monitors on throughout procedure. See vital signs in flow sheet. Tolerated Procedure well. 

## 2016-11-01 NOTE — Anesthesia Procedure Notes (Signed)
Anesthesia Regional Block: Axillary brachial plexus block   Pre-Anesthetic Checklist: ,, timeout performed, Correct Patient, Correct Site, Correct Laterality, Correct Procedure, Correct Position, site marked, Risks and benefits discussed,  Surgical consent,  Pre-op evaluation,  At surgeon's request and post-op pain management  Laterality: Right  Prep: chloraprep       Needles:  Injection technique: Single-shot  Needle Type: Stimiplex     Needle Length: 10cm  Needle Gauge: 21     Additional Needles:   Procedures: ultrasound guided,,,,,,,,  Motor weakness within 5 minutes.  Narrative:  Start time: 11/01/2016 9:20 AM End time: 11/01/2016 9:25 AM Injection made incrementally with aspirations every 5 mL.  Performed by: Personally  Anesthesiologist: Lewie Loron  Additional Notes: Nerve located and needle positioned with direct ultrasound guidance. Good perineural spread. Patient tolerated well.

## 2016-11-01 NOTE — Anesthesia Preprocedure Evaluation (Signed)
Anesthesia Evaluation  Patient identified by MRN, date of birth, ID band Patient awake    Reviewed: Allergy & Precautions, NPO status , Patient's Chart, lab work & pertinent test results  Airway Mallampati: II  TM Distance: >3 FB Neck ROM: Full    Dental no notable dental hx.    Pulmonary neg pulmonary ROS,    Pulmonary exam normal breath sounds clear to auscultation       Cardiovascular hypertension, negative cardio ROS Normal cardiovascular exam Rhythm:Regular Rate:Normal     Neuro/Psych negative neurological ROS  negative psych ROS   GI/Hepatic negative GI ROS, Neg liver ROS,   Endo/Other  negative endocrine ROS  Renal/GU negative Renal ROS     Musculoskeletal  (+) Arthritis ,   Abdominal   Peds  Hematology negative hematology ROS (+)   Anesthesia Other Findings   Reproductive/Obstetrics negative OB ROS                             Anesthesia Physical Anesthesia Plan  ASA: II  Anesthesia Plan: Regional   Post-op Pain Management:    Induction:   Airway Management Planned:   Additional Equipment:   Intra-op Plan:   Post-operative Plan:   Informed Consent: I have reviewed the patients History and Physical, chart, labs and discussed the procedure including the risks, benefits and alternatives for the proposed anesthesia with the patient or authorized representative who has indicated his/her understanding and acceptance.   Dental advisory given  Plan Discussed with: CRNA  Anesthesia Plan Comments:         Anesthesia Quick Evaluation

## 2016-11-01 NOTE — Anesthesia Postprocedure Evaluation (Signed)
Anesthesia Post Note  Patient: Penny Byrd  Procedure(s) Performed: Procedure(s) (LRB): OPEN REDUCTION INTERNAL FIXATION (ORIF) RIGHT DISTAL RADIUS AND ULNA FRACTURES (Right)  Patient location during evaluation: PACU Anesthesia Type: Regional Level of consciousness: awake and alert Pain management: pain level controlled Vital Signs Assessment: post-procedure vital signs reviewed and stable Respiratory status: spontaneous breathing Cardiovascular status: stable Anesthetic complications: no       Last Vitals:  Vitals:   11/01/16 1115 11/01/16 1130  BP: 106/84   Pulse: 89 89  Resp: 15 14  Temp:      Last Pain:  Vitals:   11/01/16 1107  TempSrc:   PainSc: 0-No pain                 Lewie Loron

## 2016-11-01 NOTE — Discharge Instructions (Signed)
Post Anesthesia Home Care Instructions  Activity: Get plenty of rest for the remainder of the day. A responsible individual must stay with you for 24 hours following the procedure.  For the next 24 hours, DO NOT: -Drive a car -Advertising copywriter -Drink alcoholic beverages -Take any medication unless instructed by your physician -Make any legal decisions or sign important papers.  Meals: Start with liquid foods such as gelatin or soup. Progress to regular foods as tolerated. Avoid greasy, spicy, heavy foods. If nausea and/or vomiting occur, drink only clear liquids until the nausea and/or vomiting subsides. Call your physician if vomiting continues.  Special Instructions/Symptoms: Your throat may feel dry or sore from the anesthesia or the breathing tube placed in your throat during surgery. If this causes discomfort, gargle with warm salt water. The discomfort should disappear within 24 hours.  If you had a scopolamine patch placed behind your ear for the management of post- operative nausea and/or vomiting:  1. The medication in the patch is effective for 72 hours, after which it should be removed.  Wrap patch in a tissue and discard in the trash. Wash hands thoroughly with soap and water. 2. You may remove the patch earlier than 72 hours if you experience unpleasant side effects which may include dry mouth, dizziness or visual disturbances. 3. Avoid touching the patch. Wash your hands with soap and water after contact with the patch.      Regional Anesthesia Blocks  1. Numbness or the inability to move the "blocked" extremity may last from 3-48 hours after placement. The length of time depends on the medication injected and your individual response to the medication. If the numbness is not going away after 48 hours, call your surgeon.  2. The extremity that is blocked will need to be protected until the numbness is gone and the  Strength has returned. Because you cannot feel it, you  will need to take extra care to avoid injury. Because it may be weak, you may have difficulty moving it or using it. You may not know what position it is in without looking at it while the block is in effect.  3. For blocks in the legs and feet, returning to weight bearing and walking needs to be done carefully. You will need to wait until the numbness is entirely gone and the strength has returned. You should be able to move your leg and foot normally before you try and bear weight or walk. You will need someone to be with you when you first try to ensure you do not fall and possibly risk injury.  4. Bruising and tenderness at the needle site are common side effects and will resolve in a few days.  5. Persistent numbness or new problems with movement should be communicated to the surgeon or the [facility] [phone] [facility] [phone]).    Postoperative instructions:  Weightbearing instructions: non weight bearing  Dressing instructions: Keep your dressing and/or splint clean and dry at all times.  It will be removed at your first post-operative appointment.  Your stitches and/or staples will be removed at this visit.  Incision instructions:  Do not soak your incision for 3 weeks after surgery.  If the incision gets wet, pat dry and do not scrub the incision.  Pain control:  You have been given a prescription to be taken as directed for post-operative pain control.  In addition, elevate the operative extremity above the heart at all times to  prevent swelling and throbbing pain.  Take over-the-counter Colace,  by mouth twice a day while taking narcotic pain medications to help prevent constipation.  Follow up appointments: 1) 10-14 days for suture removal and wound check. 2) Dr. Roda Shutters as scheduled.   -------------------------------------------------------------------------------------------------------------  After Surgery Pain Control:  After  your surgery, post-surgical discomfort or pain is likely. This discomfort can last several days to a few weeks. At certain times of the day your discomfort may be more intense.  Did you receive a nerve block?  A nerve block can provide pain relief for one hour to two days after your surgery. As long as the nerve block is working, you will experience little or no sensation in the area the surgeon operated on.  As the nerve block wears off, you will begin to experience pain or discomfort. It is very important that you begin taking your prescribed pain medication before the nerve block fully wears off. Treating your pain at the first sign of the block wearing off will ensure your pain is better controlled and more tolerable when full-sensation returns. Do not wait until the pain is intolerable, as the medicine will be less effective. It is better to treat pain in advance than to try and catch up.  General Anesthesia:  If you did not receive a nerve block during your surgery, you will need to start taking your pain medication shortly after your surgery and should continue to do so as prescribed by your surgeon.  Pain Medication:  Most commonly we prescribe Vicodin and Percocet for post-operative pain. Both of these medications contain a combination of acetaminophen (Tylenol) and a narcotic to help control pain.   It takes between 30 and 45 minutes before pain medication starts to work. It is important to take your medication before your pain level gets too intense.   Nausea is a common side effect of many pain medications. You will want to eat something before taking your pain medicine to help prevent nausea.   If you are taking a prescription pain medication that contains acetaminophen, we recommend that you do not take additional over the counter acetaminophen (Tylenol).  Other pain relieving options:   Using a cold pack to ice the affected area a few times a day (15 to 20 minutes at a time) can help  to relieve pain, reduce swelling and bruising.   Elevation of the affected area can also help to reduce pain and swelling.

## 2016-11-01 NOTE — Transfer of Care (Signed)
Immediate Anesthesia Transfer of Care Note  Patient: Penny Byrd  Procedure(s) Performed: Procedure(s): OPEN REDUCTION INTERNAL FIXATION (ORIF) RIGHT DISTAL RADIUS AND ULNA FRACTURES (Right)  Patient Location: PACU  Anesthesia Type:MAC and Regional  Level of Consciousness: awake, alert  and oriented  Airway & Oxygen Therapy: Patient Spontanous Breathing and Patient connected to face mask oxygen  Post-op Assessment: Report given to RN, Post -op Vital signs reviewed and stable and Patient moving all extremities  Post vital signs: Reviewed and stable  Last Vitals:  Vitals:   11/01/16 0930 11/01/16 0935  BP: (!) 148/73   Pulse:    Resp: 15 16  Temp:      Last Pain:  Vitals:   11/01/16 0825  TempSrc: Oral         Complications: No apparent anesthesia complications

## 2016-11-01 NOTE — Op Note (Signed)
   Date of Surgery: 11/01/2016  INDICATIONS: Penny Byrd is a 56 y.o.-year-old female with a right distal radius fracture;  The patient did consent to the procedure after discussion of the risks and benefits.  PREOPERATIVE DIAGNOSIS: Right distal radius and ulnar styloid fracture  POSTOPERATIVE DIAGNOSIS: Same.  PROCEDURE: Open reduction internal fixation of right distal radius fracture, extraarticular  SURGEON: N. Glee Arvin, M.D.  ASSIST: none.  ANESTHESIA:  regional  IV FLUIDS AND URINE: See anesthesia.  ESTIMATED BLOOD LOSS: minimal mL.  IMPLANTS: Biomet   DRAINS: none  COMPLICATIONS: None.  DESCRIPTION OF PROCEDURE: The patient was brought to the operating room and placed supine on the operating table.  The patient had been signed prior to the procedure and this was documented. The patient had the anesthesia placed by the anesthesiologist.  A time-out was performed to confirm that this was the correct patient, site, side and location. The patient did receive antibiotics prior to the incision and was re-dosed during the procedure as needed at indicated intervals.  A tourniquet was placed.  The patient had the operative extremity prepped and draped in the standard surgical fashion.    A standard FCR approach to the volar distal radius was used. This was extended proximally to gain adequate exposure. Blunt dissection was carried down to the pronator quadratus. The pronator quadratus was then elevated off of the distal radius. The fracture lines were visualized. This was an extra-articular fracture with significant comminution of the metaphyseal diaphyseal junction. I then affected a reduction by using gentle traction. Fluoroscopy was used to confirm appropriate fracture reduction and alignment. A long distal radius plate was used in order to have enough cortical purchase proximally. Nonlocking screws were placed in the radial shaft each with excellent purchase. We then placed locking  screws distally through the plate into the distal fragment portion. Fluoroscopy was used to confirm no intra-articular penetration of the hardware. Final x-rays were then taken. The wound was thoroughly irrigated. Tourniquet was deflated and hemostasis was obtained. The wound was closed in layer fashion using 3-0 Vicryl and 4-0 nylon. Sterile dressings were applied. The wrist was immobilized in a plaster splint. Patient tolerated procedure well and had no immediate competitions.  POSTOPERATIVE PLAN: Patient will be discharged home.  Mayra Reel, MD [facility] [phone] 11:01 AM

## 2016-11-02 ENCOUNTER — Encounter (HOSPITAL_BASED_OUTPATIENT_CLINIC_OR_DEPARTMENT_OTHER): Payer: Self-pay | Admitting: Orthopaedic Surgery

## 2016-11-16 ENCOUNTER — Ambulatory Visit (INDEPENDENT_AMBULATORY_CARE_PROVIDER_SITE_OTHER): Payer: Worker's Compensation | Admitting: Orthopaedic Surgery

## 2016-11-16 ENCOUNTER — Encounter (INDEPENDENT_AMBULATORY_CARE_PROVIDER_SITE_OTHER): Payer: Self-pay | Admitting: Orthopaedic Surgery

## 2016-11-16 ENCOUNTER — Ambulatory Visit (INDEPENDENT_AMBULATORY_CARE_PROVIDER_SITE_OTHER): Payer: Self-pay

## 2016-11-16 DIAGNOSIS — S52601D Unspecified fracture of lower end of right ulna, subsequent encounter for closed fracture with routine healing: Secondary | ICD-10-CM

## 2016-11-16 DIAGNOSIS — S52501D Unspecified fracture of the lower end of right radius, subsequent encounter for closed fracture with routine healing: Secondary | ICD-10-CM

## 2016-11-16 MED ORDER — TRAMADOL HCL 50 MG PO TABS: 50.0000 mg | ORAL_TABLET | Freq: Three times a day (TID) | ORAL | 0 refills | Status: DC | PRN

## 2016-11-16 NOTE — Progress Notes (Signed)
Patient is 2 weeks status post ORIF of a right distal radius fracture. She complains of some mild pain and swelling. She is overall improving. The incision healed without any signs of infection. The sutures were removed. X-ray show stable fixation. At this point I would like her to begin gentle range of motion. Removal wrist brace was given. I recommend an exercise ball for the hand to begin finger range of motion and strengthening. She does need to continue to be out of work. She is not appropriate to return to work at this point. I'll see her back in 4 weeks for repeat 2 view x-rays of the right wrist.

## 2016-12-18 ENCOUNTER — Ambulatory Visit (INDEPENDENT_AMBULATORY_CARE_PROVIDER_SITE_OTHER): Payer: Worker's Compensation | Admitting: Orthopaedic Surgery

## 2016-12-18 ENCOUNTER — Ambulatory Visit (INDEPENDENT_AMBULATORY_CARE_PROVIDER_SITE_OTHER): Payer: Self-pay

## 2016-12-18 ENCOUNTER — Encounter (INDEPENDENT_AMBULATORY_CARE_PROVIDER_SITE_OTHER): Payer: Self-pay | Admitting: Orthopaedic Surgery

## 2016-12-18 DIAGNOSIS — S52601D Unspecified fracture of lower end of right ulna, subsequent encounter for closed fracture with routine healing: Secondary | ICD-10-CM | POA: Diagnosis not present

## 2016-12-18 DIAGNOSIS — S52501D Unspecified fracture of the lower end of right radius, subsequent encounter for closed fracture with routine healing: Secondary | ICD-10-CM

## 2016-12-18 NOTE — Progress Notes (Signed)
Patient is 7 weeks status post ORIF of a severe right distal radius fracture. She is overall improving still has throbbing pain at night. On exam she does have mild pain with expected post injury and surgical stiffness. She does not have any significant swelling. Her surgical scars fully healed. She has no signs of CRPS. X-ray show stable alignment and evidence of healing. At this point I would like her to begin hand therapy for 6 weeks. She is not appropriate for her going back to work yet as her wrist is significantly still weak and deconditioned. I'll see her back in 6 weeks with repeat 3 view x-rays of the right wrist. May consider work conditioning at that time.

## 2016-12-19 ENCOUNTER — Other Ambulatory Visit: Payer: Self-pay | Admitting: Adult Health

## 2016-12-19 ENCOUNTER — Telehealth (INDEPENDENT_AMBULATORY_CARE_PROVIDER_SITE_OTHER): Payer: Self-pay | Admitting: Orthopaedic Surgery

## 2016-12-19 NOTE — Telephone Encounter (Signed)
Penny Byrd called needing the referral faxed over to [phone] Thank you!

## 2016-12-19 NOTE — Telephone Encounter (Signed)
FAXED

## 2017-01-11 ENCOUNTER — Other Ambulatory Visit: Payer: Self-pay | Admitting: Obstetrics & Gynecology

## 2017-01-11 DIAGNOSIS — R928 Other abnormal and inconclusive findings on diagnostic imaging of breast: Secondary | ICD-10-CM

## 2017-01-16 ENCOUNTER — Ambulatory Visit
Admission: RE | Admit: 2017-01-16 | Discharge: 2017-01-16 | Disposition: A | Discharge status: Home / Self Care | Payer: 59 | Source: Ambulatory Visit | Attending: Obstetrics & Gynecology | Admitting: Obstetrics & Gynecology

## 2017-01-16 ENCOUNTER — Other Ambulatory Visit: Payer: Self-pay | Admitting: Obstetrics & Gynecology

## 2017-01-16 DIAGNOSIS — R928 Other abnormal and inconclusive findings on diagnostic imaging of breast: Secondary | ICD-10-CM

## 2017-01-16 DIAGNOSIS — N6489 Other specified disorders of breast: Secondary | ICD-10-CM

## 2017-01-29 ENCOUNTER — Ambulatory Visit (INDEPENDENT_AMBULATORY_CARE_PROVIDER_SITE_OTHER): Payer: Worker's Compensation

## 2017-01-29 ENCOUNTER — Ambulatory Visit (INDEPENDENT_AMBULATORY_CARE_PROVIDER_SITE_OTHER): Payer: Worker's Compensation | Admitting: Orthopaedic Surgery

## 2017-01-29 ENCOUNTER — Encounter (INDEPENDENT_AMBULATORY_CARE_PROVIDER_SITE_OTHER): Payer: Self-pay | Admitting: Orthopaedic Surgery

## 2017-01-29 DIAGNOSIS — S52601D Unspecified fracture of lower end of right ulna, subsequent encounter for closed fracture with routine healing: Secondary | ICD-10-CM | POA: Diagnosis not present

## 2017-01-29 DIAGNOSIS — S52501D Unspecified fracture of the lower end of right radius, subsequent encounter for closed fracture with routine healing: Secondary | ICD-10-CM | POA: Diagnosis not present

## 2017-01-29 NOTE — Progress Notes (Signed)
Patient is almost 3 months status post ORIF of a significantly comminuted distal radius and shaft fracture. She is progressing with therapy. She still has significant weakness in grip strength. 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 Her range of motion is coming along and doing quite well. She has near full wrist extension and flexion. She has normal pronation and supination as well as radial and ulnar deviation. Her x-ray show significant signs of healing and bony consolidation. There is stable fixation. At this point I feel carpal with her returning back to work on Thursday doing no more than 10 pounds of lifting. I think she needs to continue with physical therapy so that she can improve on her strength. Her range of motion is coming along quite well. I'll see her back in 2 months with 2 view x-rays of the right wrist.

## 2017-02-14 ENCOUNTER — Other Ambulatory Visit: Payer: Self-pay | Admitting: *Deleted

## 2017-02-14 MED ORDER — METOPROLOL SUCCINATE ER 50 MG PO TB24: ORAL_TABLET | ORAL | 0 refills | Status: DC

## 2017-04-02 ENCOUNTER — Ambulatory Visit (INDEPENDENT_AMBULATORY_CARE_PROVIDER_SITE_OTHER): Payer: Self-pay | Admitting: Orthopaedic Surgery

## 2017-04-12 ENCOUNTER — Ambulatory Visit (INDEPENDENT_AMBULATORY_CARE_PROVIDER_SITE_OTHER): Payer: Worker's Compensation | Admitting: Orthopaedic Surgery

## 2017-04-12 ENCOUNTER — Encounter (INDEPENDENT_AMBULATORY_CARE_PROVIDER_SITE_OTHER): Payer: Self-pay | Admitting: Orthopaedic Surgery

## 2017-04-12 ENCOUNTER — Ambulatory Visit (INDEPENDENT_AMBULATORY_CARE_PROVIDER_SITE_OTHER): Payer: Worker's Compensation

## 2017-04-12 DIAGNOSIS — S52501D Unspecified fracture of the lower end of right radius, subsequent encounter for closed fracture with routine healing: Secondary | ICD-10-CM | POA: Diagnosis not present

## 2017-04-12 DIAGNOSIS — S52601D Unspecified fracture of lower end of right ulna, subsequent encounter for closed fracture with routine healing: Secondary | ICD-10-CM

## 2017-04-12 NOTE — Progress Notes (Signed)
Office Visit Note   Patient: Penny Byrd           Date of Birth: 1960-07-26           MRN: [id number] Visit Date: 04/12/2017              Requested by: Benita Stabile, MD [address] [place], [place] [address] PCP: Benita Stabile, MD   Assessment & Plan: Visit Diagnoses:  1. Closed fracture of distal ends of right radius and ulna with routine healing, subsequent encounter     Plan: At this point patient has reached MMI. She is doing well. She does have a little discomfort from her nonunion of her ulnar styloid. She is happy overall with her progress. Questions encouraged and answered. Follow-up as needed.  Follow-Up Instructions: Return if symptoms worsen or fail to improve.   Orders:  Orders Placed This Encounter  Procedures  . XR Wrist 2 Views Right   No orders of the defined types were placed in this encounter.     Procedures: No procedures performed   Clinical Data: No additional findings.   Subjective: Chief Complaint  Patient presents with  . Right Wrist - Pain, Follow-up    Patient is 5 months status post ORIF right distal radius fracture. She is doing much better. She has finished hand therapy. She overall has some weakness with gripping, writing, eating. She does not have any significant pain. She has occasional numbness.    Review of Systems   Objective: Vital Signs: There were no vitals taken for this visit.  Physical Exam  Ortho Exam Right wrist range of motion is excellent. She has good pronation supination. Surgical scar is fully healed. Specialty Comments:  No specialty comments available.  Imaging: Xr Wrist 2 Views Right  Result Date: 04/12/2017 Healed distal radius fracture. Chronic ulnar styloid nonunion.    PMFS History: Patient Active Problem List   Diagnosis Date Noted  . Closed fracture of right distal radius and ulna 11/01/2016  . Obesity (BMI 30.0-34.9) 02/04/2013  . Dyslipidemia 02/04/2013  . Essential  hypertension 02/04/2013  . PFO (patent foramen ovale) 02/04/2013  . Palpitations 02/04/2013  . PAF (paroxysmal atrial fibrillation) (HCC) 02/04/2013  . Swelling of skeletal muscle 10/10/2011  . Pain in thoracic spine 10/10/2011   Past Medical History:  Diagnosis Date  . Arthritis    hands  . Distal radius fracture, right   . Essential hypertension   . GERD (gastroesophageal reflux disease)   . History of stress test 06/29/2008   Normal Myocardial perfusion imaging, No significant ischemia demonstrated this is a low risk scan, No prior study availiable for comparison  . Obesity   . Paroxysmal atrial fibrillation (HCC)   . PVC's (premature ventricular contractions)    a. rare PVCs on event monitor in 2017.    Family History  Problem Relation Age of Onset  . Heart failure Mother   . Cancer Father   . Heart disease Father   . Healthy Sister   . Hypertension Brother   . Breast cancer Paternal Aunt     Past Surgical History:  Procedure Laterality Date  . ABDOMINAL HYSTERECTOMY  age 35  . BREAST EXCISIONAL BIOPSY Right    2 benign biopsies right breast  . BREAST LUMPECTOMY Right 04/1998   Dr Precious Reel, Benign Right breast disease  . OPEN REDUCTION INTERNAL FIXATION (ORIF) DISTAL RADIAL FRACTURE Right 11/01/2016   Procedure: OPEN REDUCTION INTERNAL FIXATION (ORIF) RIGHT DISTAL RADIUS  FRACTURE;  Surgeon: Tarry Kos, MD;  Location: [facility];  Service: Orthopedics;  Laterality: Right;  . PATELLA FRACTURE SURGERY     right  . TONSILLECTOMY  2000   Social History   Occupational History  . Not on file.   Social History Main Topics  . Smoking status: Never Smoker  . Smokeless tobacco: Never Used  . Alcohol use No  . Drug use: No  . Sexual activity: Not on file

## 2017-07-19 ENCOUNTER — Ambulatory Visit
Admission: RE | Admit: 2017-07-19 | Discharge: 2017-07-19 | Disposition: A | Discharge status: Home / Self Care | Payer: 59 | Source: Ambulatory Visit | Attending: Obstetrics & Gynecology | Admitting: Obstetrics & Gynecology

## 2017-07-19 ENCOUNTER — Ambulatory Visit: Admission: RE | Admit: 2017-07-19 | Payer: Self-pay | Source: Ambulatory Visit

## 2017-07-19 ENCOUNTER — Other Ambulatory Visit: Payer: Self-pay

## 2017-07-19 DIAGNOSIS — N6489 Other specified disorders of breast: Secondary | ICD-10-CM

## 2017-09-17 ENCOUNTER — Other Ambulatory Visit: Payer: Self-pay | Admitting: Cardiology

## 2017-10-10 ENCOUNTER — Ambulatory Visit: Payer: 59 | Admitting: Student

## 2017-10-10 ENCOUNTER — Encounter: Payer: Self-pay | Admitting: Student

## 2017-10-10 VITALS — BP 132/82 | HR 69 | Ht 61.0 in | Wt 179.0 lb

## 2017-10-10 DIAGNOSIS — R002 Palpitations: Secondary | ICD-10-CM

## 2017-10-10 DIAGNOSIS — Q2112 Patent foramen ovale: Secondary | ICD-10-CM

## 2017-10-10 DIAGNOSIS — I10 Essential (primary) hypertension: Secondary | ICD-10-CM | POA: Diagnosis not present

## 2017-10-10 DIAGNOSIS — Q211 Atrial septal defect: Secondary | ICD-10-CM

## 2017-10-10 DIAGNOSIS — I493 Ventricular premature depolarization: Secondary | ICD-10-CM

## 2017-10-10 MED ORDER — METOPROLOL SUCCINATE ER 50 MG PO TB24: ORAL_TABLET | ORAL | 3 refills | Status: DC

## 2017-10-10 MED ORDER — ASPIRIN EC 81 MG PO TBEC: 81.0000 mg | DELAYED_RELEASE_TABLET | Freq: Every day | ORAL | 3 refills | Status: DC

## 2017-10-10 NOTE — Progress Notes (Signed)
Cardiology Office Note    Date:  10/10/2017   ID:  Penny Europeeresa F Sayre, DOB [birth year], MRN [id number]  PCP:  Benita StabileHall, John Z, MD  Cardiologist: Nona DellSamuel McDowell, MD    Chief Complaint  Patient presents with  . Follow-up    Overdue annual visit    History of Present Illness:    Penny Byrd is a 57 y.o. female with past medical history of palpitations (PVCs by prior event monitor), known PFO (by echo in 2012), HTN, and GERD who presents to the office today for overdue follow-up.  She was last examined by Dr. Diona BrownerMcDowell in 07/2016 and reported occasional palpitations with no associated dizziness or presyncope. She was continued on her current medication regimen including Toprol-XL 50mg  daily.    In talking with the patient today, she reports overall doing well since her last office visit. She does experience occasional palpitations lasting for only a few seconds up to a minute and denies any associated symptoms with this. Limits caffeine intake to 1 cup of coffee daily. No alcohol use. She does not exercise regularly but reports being active at work and performing household chores. No recent chest pain, dyspnea on exertion, orthopnea, PND, or lower extremity edema.  She was switched from Valsartan to Olmesartan by her PCP due to the recent recall. However, over the past few months her pharmacy is now unable to obtain this medication. She anticipates switching to Telmisartan at the time of her next refill. Blood pressure is well controlled at 132/82 during today's visit.   Past Medical History:  Diagnosis Date  . Arthritis    hands  . Distal radius fracture, right   . Essential hypertension   . GERD (gastroesophageal reflux disease)   . History of stress test 06/29/2008   Normal Myocardial perfusion imaging, No significant ischemia demonstrated this is a low risk scan, No prior study availiable for comparison  . Obesity   . Paroxysmal atrial fibrillation (HCC)   . PVC's (premature  ventricular contractions)    a. rare PVCs on event monitor in 2017.    Past Surgical History:  Procedure Laterality Date  . ABDOMINAL HYSTERECTOMY  age 57  . BREAST EXCISIONAL BIOPSY Right    2 benign biopsies right breast  . BREAST LUMPECTOMY Right 04/1998   Dr Precious ReelAnita Lindsey, Benign Right breast disease  . OPEN REDUCTION INTERNAL FIXATION (ORIF) DISTAL RADIAL FRACTURE Right 11/01/2016   Procedure: OPEN REDUCTION INTERNAL FIXATION (ORIF) RIGHT DISTAL RADIUS  FRACTURE;  Surgeon: Tarry KosNaiping M Xu, MD;  Location: [facility];  Service: Orthopedics;  Laterality: Right;  . PATELLA FRACTURE SURGERY     right  . TONSILLECTOMY  2000    Current Medications: Outpatient Medications Prior to Visit  Medication Sig Dispense Refill  . estradiol (ESTRACE) 1 MG tablet Take 1 tablet by mouth daily.    . Omega-3 Fatty Acids (FISH OIL) 1200 MG CAPS Take 1,200 mg by mouth daily.     Marland Kitchen. omeprazole (PRILOSEC) 20 MG capsule Take 20 mg by mouth daily.    Marland Kitchen. telmisartan (MICARDIS) 40 MG tablet Take 40 mg by mouth daily.    Marland Kitchen. aspirin EC 325 MG tablet Take 1 tablet (325 mg total) by mouth daily. 30 tablet 11  . HYDROcodone-acetaminophen (NORCO) 7.5-325 MG tablet Take 1 tablet by mouth every 4 (four) hours as needed for moderate pain. 30 tablet 0  . metoprolol succinate (TOPROL-XL) 50 MG 24 hr tablet TAKE ONE TABLET BY MOUTH EVERY DAY WITH  OR IMMEDIATELY FOLLOWING SUPPER 30 tablet 0  . ondansetron (ZOFRAN) 4 MG tablet Take 1-2 tablets (4-8 mg total) by mouth every 8 (eight) hours as needed for nausea or vomiting. 40 tablet 0  . HYDROmorphone (DILAUDID) 2 MG tablet Take 1 tablet (2 mg total) by mouth every 4 (four) hours as needed for severe pain. (Patient not taking: Reported on 01/29/2017) 60 tablet 0  . methocarbamol (ROBAXIN) 750 MG tablet Take 1 tablet (750 mg total) by mouth 2 (two) times daily as needed for muscle spasms. (Patient not taking: Reported on 01/29/2017) 60 tablet 0  . oxyCODONE (OXYCONTIN)  10 mg 12 hr tablet Take 1 tablet (10 mg total) by mouth every 12 (twelve) hours. (Patient not taking: Reported on 01/29/2017) 10 tablet 0  . promethazine (PHENERGAN) 12.5 MG tablet Take 1 tablet (12.5 mg total) by mouth every 4 (four) hours as needed for nausea or vomiting. 60 tablet 2  . senna-docusate (SENOKOT S) 8.6-50 MG tablet Take 1 tablet by mouth at bedtime as needed. 30 tablet 1  . traMADol (ULTRAM) 50 MG tablet Take 1 tablet (50 mg total) by mouth 3 (three) times daily as needed. (Patient not taking: Reported on 01/29/2017) 60 tablet 0  . valsartan (DIOVAN) 80 MG tablet TAKE ONE (1) TABLET BY MOUTH EVERY DAY 30 tablet 11   No facility-administered medications prior to visit.      Allergies:   Patient has no known allergies.   Social History   Socioeconomic History  . Marital status: Married    Spouse name: Not on file  . Number of children: Not on file  . Years of education: Not on file  . Highest education level: Not on file  Occupational History  . Not on file  Social Needs  . Financial resource strain: Not on file  . Food insecurity:    Worry: Not on file    Inability: Not on file  . Transportation needs:    Medical: Not on file    Non-medical: Not on file  Tobacco Use  . Smoking status: Never Smoker  . Smokeless tobacco: Never Used  Substance and Sexual Activity  . Alcohol use: No    Alcohol/week: 0.0 oz  . Drug use: No  . Sexual activity: Not on file  Lifestyle  . Physical activity:    Days per week: Not on file    Minutes per session: Not on file  . Stress: Not on file  Relationships  . Social connections:    Talks on phone: Not on file    Gets together: Not on file    Attends religious service: Not on file    Active member of club or organization: Not on file    Attends meetings of clubs or organizations: Not on file    Relationship status: Not on file  Other Topics Concern  . Not on file  Social History Narrative  . Not on file     Family  History:  The patient's family history includes Breast cancer in her paternal aunt; Cancer in her father; Healthy in her sister; Heart disease in her father; Heart failure in her mother; Hypertension in her brother.   Review of Systems:   Please see the history of present illness.     General:  No chills, fever, night sweats or weight changes.  Cardiovascular:  No chest pain, dyspnea on exertion, edema, orthopnea, paroxysmal nocturnal dyspnea. Positive for palpitations.  Dermatological: No rash, lesions/masses Respiratory: No cough, dyspnea Urologic:  No hematuria, dysuria Abdominal:   No nausea, vomiting, diarrhea, bright red blood per rectum, melena, or hematemesis Neurologic:  No visual changes, wkns, changes in mental status.  All other systems reviewed and are otherwise negative except as noted above.   Physical Exam:    VS:  BP 132/82   Pulse 69   Ht 5\' 1"  (1.549 m)   Wt 179 lb (81.2 kg)   SpO2 98%   BMI 33.82 kg/m    General: Well developed, well nourished Caucasian female appearing in no acute distress. Head: Normocephalic, atraumatic, sclera non-icteric, no xanthomas, nares are without discharge.  Neck: No carotid bruits. JVD not elevated.  Lungs: Respirations regular and unlabored, without wheezes or rales.  Heart: Regular rate and rhythm. No S3 or S4.  No murmur, no rubs, or gallops appreciated. Abdomen: Soft, non-tender, non-distended with normoactive bowel sounds. No hepatomegaly. No rebound/guarding. No obvious abdominal masses. Msk:  Strength and tone appear normal for age. No joint deformities or effusions. Extremities: No clubbing or cyanosis. No lower extremity edema.  Distal pedal pulses are 2+ bilaterally. Neuro: Alert and oriented X 3. Moves all extremities spontaneously. No focal deficits noted. Psych:  Responds to questions appropriately with a normal affect. Skin: No rashes or lesions noted  Wt Readings from Last 3 Encounters:  10/10/17 179 lb (81.2 kg)    11/01/16 176 lb (79.8 kg)  10/25/16 176 lb (79.8 kg)     Studies/Labs Reviewed:   EKG:  EKG is ordered today. The ekg ordered today demonstrates normal sinus rhythm, heart rate 67, with no acute ST or T wave changes when compared to prior tracings.  Recent Labs: No results found for requested labs within last 8760 hours.   Lipid Panel    Component Value Date/Time   CHOL 178 05/14/2015 0826   CHOL 179 01/31/2013 0755   TRIG 143 05/14/2015 0826   TRIG 116 01/31/2013 0755   HDL 38 (L) 05/14/2015 0826   HDL 36 (L) 01/31/2013 0755   CHOLHDL 4.7 05/14/2015 0826   VLDL 29 05/14/2015 0826   LDLCALC 111 05/14/2015 0826   LDLCALC 120 (H) 01/31/2013 0755    Additional studies/ records that were reviewed today include:   Event Monitor: 11/2015 14 day event recorder. Sinus rhythm noted with rare PVCs. Significant lead artifact noted intermittently as well. No obvious sustained arrhythmias or pauses.  Echo: 2012 EF > 55%, elevated LV filling pressure, small left to right atrial shunt by color doppler with no evidence of right to left shunting by microbubble contrast, trace to mild MR, trace TR, and normal RVSP.    Assessment:    1. Palpitations   2. PVC (premature ventricular contraction)   3. Essential hypertension   4. PFO (patent foramen ovale)      Plan:   In order of problems listed above:  1. Palpitations/ Premature Ventricular Contractions - The patient has a prolonged history of palpitations and had a remote history of atrial fibrillation with no known recent recurrence. Most recent event monitor showed normal sinus rhythm with rare PVC's. - She experiences occasional palpitations lasting for a few seconds up to a minute but denies any sustained palpitations or tachy-palpitations. No associated lightheadedness, dizziness, or presyncope.  Labs recently checked by her PCP and within normal limits by her report. Reviewed the importance of limiting caffeine intake. Will  continue on Toprol-XL 50 mg daily.  2. HTN - BP is well controlled at 132/82 during today's visit. - continue Toprol-XL 50mg   daily and ARB (switching from Olmesartan to Telmisartan due to Pharmacy availability due to the recent recalls). Encouraged her to continue to follow blood pressure at home.  3. PFO - echo in 2012 showed a small left to right atrial shunt by color doppler with no evidence of right to left shunting by microbubble contrast, most consistent with a PFO.  - remains on ASA (reduce dosing to 81mg  daily as recent studies have shown no reduced CVA risk with 325mg  dosing and higher dosing does increase the risk of bleeding).    Medication Adjustments/Labs and Tests Ordered: Current medicines are reviewed at length with the patient today.  Concerns regarding medicines are outlined above.  Medication changes, Labs and Tests ordered today are listed in the Patient Instructions below. Patient Instructions  Medication Instructions:  Your physician has recommended you make the following change in your medication:  Decrease Aspirin to 81 mg   Labwork: NONE   Testing/Procedures: NONE   Follow-Up: Your physician wants you to follow-up in: 1 Year. You will receive a reminder letter in the mail two months in advance. If you don't receive a letter, please call our office to schedule the follow-up appointment.  Any Other Special Instructions Will Be Listed Below (If Applicable).  If you need a refill on your cardiac medications before your next appointment, please call your pharmacy. Thank you for choosing [facility]!     Signed, Ellsworth Lennox, PA-C  10/10/2017 4:30 PM    [facility] [address] [place], [place] [address] Phone: [phone]

## 2017-10-10 NOTE — Patient Instructions (Signed)
Medication Instructions:  Your physician has recommended you make the following change in your medication:  Decrease Aspirin to 81 mg    Labwork: NONE   Testing/Procedures: NONE   Follow-Up: Your physician wants you to follow-up in: 1 Year. You will receive a reminder letter in the mail two months in advance. If you don't receive a letter, please call our office to schedule the follow-up appointment.   Any Other Special Instructions Will Be Listed Below (If Applicable).     If you need a refill on your cardiac medications before your next appointment, please call your pharmacy. Thank you for choosing [facility]!

## 2017-12-28 DIAGNOSIS — I10 Essential (primary) hypertension: Secondary | ICD-10-CM | POA: Diagnosis not present

## 2017-12-28 DIAGNOSIS — Z6834 Body mass index (BMI) 34.0-34.9, adult: Secondary | ICD-10-CM | POA: Diagnosis not present

## 2017-12-28 DIAGNOSIS — I480 Paroxysmal atrial fibrillation: Secondary | ICD-10-CM | POA: Diagnosis not present

## 2017-12-28 DIAGNOSIS — E6609 Other obesity due to excess calories: Secondary | ICD-10-CM | POA: Diagnosis not present

## 2018-01-11 DIAGNOSIS — Z13820 Encounter for screening for osteoporosis: Secondary | ICD-10-CM | POA: Diagnosis not present

## 2018-01-11 DIAGNOSIS — Z01419 Encounter for gynecological examination (general) (routine) without abnormal findings: Secondary | ICD-10-CM | POA: Diagnosis not present

## 2018-01-11 DIAGNOSIS — Z6833 Body mass index (BMI) 33.0-33.9, adult: Secondary | ICD-10-CM | POA: Diagnosis not present

## 2018-03-27 DIAGNOSIS — Z6834 Body mass index (BMI) 34.0-34.9, adult: Secondary | ICD-10-CM | POA: Diagnosis not present

## 2018-03-27 DIAGNOSIS — L918 Other hypertrophic disorders of the skin: Secondary | ICD-10-CM | POA: Diagnosis not present

## 2018-03-27 DIAGNOSIS — Z23 Encounter for immunization: Secondary | ICD-10-CM | POA: Diagnosis not present

## 2018-07-01 DIAGNOSIS — Z8639 Personal history of other endocrine, nutritional and metabolic disease: Secondary | ICD-10-CM | POA: Diagnosis not present

## 2018-07-01 DIAGNOSIS — I10 Essential (primary) hypertension: Secondary | ICD-10-CM | POA: Diagnosis not present

## 2018-07-01 DIAGNOSIS — R928 Other abnormal and inconclusive findings on diagnostic imaging of breast: Secondary | ICD-10-CM | POA: Diagnosis not present

## 2018-07-01 DIAGNOSIS — E782 Mixed hyperlipidemia: Secondary | ICD-10-CM | POA: Diagnosis not present

## 2018-07-05 DIAGNOSIS — I480 Paroxysmal atrial fibrillation: Secondary | ICD-10-CM | POA: Diagnosis not present

## 2018-07-05 DIAGNOSIS — I10 Essential (primary) hypertension: Secondary | ICD-10-CM | POA: Diagnosis not present

## 2018-07-05 DIAGNOSIS — E6609 Other obesity due to excess calories: Secondary | ICD-10-CM | POA: Diagnosis not present

## 2018-07-05 DIAGNOSIS — E782 Mixed hyperlipidemia: Secondary | ICD-10-CM | POA: Diagnosis not present

## 2018-09-30 ENCOUNTER — Other Ambulatory Visit: Payer: Self-pay | Admitting: Student

## 2018-10-01 DIAGNOSIS — R05 Cough: Secondary | ICD-10-CM | POA: Diagnosis not present

## 2018-10-01 DIAGNOSIS — R509 Fever, unspecified: Secondary | ICD-10-CM | POA: Diagnosis not present

## 2018-10-01 DIAGNOSIS — J019 Acute sinusitis, unspecified: Secondary | ICD-10-CM | POA: Diagnosis not present

## 2018-10-07 ENCOUNTER — Telehealth: Payer: Self-pay | Admitting: Physician Assistant

## 2018-10-07 NOTE — Telephone Encounter (Signed)
Called to speak with patient about upcoming appointment with Dr. Diona Browner 10/10/2018 Due to the recent coronavirus.  She recently saw her PCP and has been stable.  No palpitations.  Can be rescheduled for follow-up with Dr. Diona Browner in 1 year.

## 2018-10-10 ENCOUNTER — Ambulatory Visit: Payer: Self-pay | Admitting: Cardiology

## 2018-10-14 DIAGNOSIS — N3001 Acute cystitis with hematuria: Secondary | ICD-10-CM | POA: Diagnosis not present

## 2019-02-24 ENCOUNTER — Other Ambulatory Visit: Payer: Self-pay | Admitting: Obstetrics & Gynecology

## 2019-02-24 DIAGNOSIS — Z1231 Encounter for screening mammogram for malignant neoplasm of breast: Secondary | ICD-10-CM

## 2019-03-25 ENCOUNTER — Ambulatory Visit
Admission: RE | Admit: 2019-03-25 | Discharge: 2019-03-25 | Disposition: A | Discharge status: Home / Self Care | Payer: PRIVATE HEALTH INSURANCE | Source: Ambulatory Visit | Attending: Obstetrics & Gynecology | Admitting: Obstetrics & Gynecology

## 2019-03-25 ENCOUNTER — Other Ambulatory Visit: Payer: Self-pay

## 2019-03-25 DIAGNOSIS — Z1231 Encounter for screening mammogram for malignant neoplasm of breast: Secondary | ICD-10-CM

## 2019-10-01 ENCOUNTER — Other Ambulatory Visit: Payer: Self-pay | Admitting: Student

## 2019-10-16 ENCOUNTER — Encounter: Payer: Self-pay | Admitting: Cardiology

## 2019-10-16 ENCOUNTER — Ambulatory Visit: Payer: PRIVATE HEALTH INSURANCE | Admitting: Cardiology

## 2019-10-16 VITALS — BP 132/62 | HR 65 | Temp 97.4°F | Ht 63.0 in | Wt 186.0 lb

## 2019-10-16 DIAGNOSIS — Z87898 Personal history of other specified conditions: Secondary | ICD-10-CM | POA: Diagnosis not present

## 2019-10-16 DIAGNOSIS — Q211 Atrial septal defect: Secondary | ICD-10-CM | POA: Diagnosis not present

## 2019-10-16 DIAGNOSIS — Q2112 Patent foramen ovale: Secondary | ICD-10-CM

## 2019-10-16 DIAGNOSIS — I10 Essential (primary) hypertension: Secondary | ICD-10-CM | POA: Diagnosis not present

## 2019-10-16 NOTE — Patient Instructions (Signed)
Medication Instructions:  Your physician recommends that you continue on your current medications as directed. Please refer to the Current Medication list given to you today.  *If you need a refill on your cardiac medications before your next appointment, please call your pharmacy*   Lab Work: None today If you have labs (blood work) drawn today and your tests are completely normal, you will receive your results only by: . MyChart Message (if you have MyChart) OR . A paper copy in the mail If you have any lab test that is abnormal or we need to change your treatment, we will call you to review the results.   Testing/Procedures: None today   Follow-Up: At [facility], you and your health needs are our priority.  As part of our continuing mission to provide you with exceptional heart care, we have created designated Provider Care Teams.  These Care Teams include your primary Cardiologist (physician) and Advanced Practice Providers (APPs -  Physician Assistants and Nurse Practitioners) who all work together to provide you with the care you need, when you need it.  We recommend signing up for the patient portal called "MyChart".  Sign up information is provided on this After Visit Summary.  MyChart is used to connect with patients for Virtual Visits (Telemedicine).  Patients are able to view lab/test results, encounter notes, upcoming appointments, etc.  Non-urgent messages can be sent to your provider as well.   To learn more about what you can do with MyChart, go to [email or web address].    Your next appointment:   12 month(s)  The format for your next appointment:   In Person  Provider:   Samuel McDowell, MD   Other Instructions None       Thank you for choosing [facility] !         

## 2019-10-16 NOTE — Progress Notes (Signed)
Cardiology Office Note  Date: 10/16/2019   ID: Penny Byrd, DOB 1960-10-19, MRN [id number]  PCP:  Benita Stabile, MD  Cardiologist:  Nona Dell, MD Electrophysiologist:  None   Chief Complaint  Patient presents with  . Cardiac follow-up    History of Present Illness: Penny Byrd is a 59 y.o. female last seen in March 2019 by Ms. Strader PA-C.  She presents for a routine follow-up visit.  She has done very well without any recurring sense of palpitations on Toprol-XL.  Continues to work full-time at a [employer].  She recently had the coronavirus vaccine.  States that she has had no major health changes.  I reviewed her recent lab work done by Penny Byrd, outlined below.  I personally reviewed her ECG today which shows normal sinus rhythm.  Past Medical History:  Diagnosis Date  . Arthritis   . Distal radius fracture, right   . Essential hypertension   . GERD (gastroesophageal reflux disease)   . History of stress test 06/29/2008   Normal Myocardial perfusion imaging, No significant ischemia demonstrated this is a low risk scan, No prior study availiable for comparison  . Obesity   . Paroxysmal atrial fibrillation (HCC)   . PVC's (premature ventricular contractions)    a. rare PVCs on event monitor in 2017.    Past Surgical History:  Procedure Laterality Date  . ABDOMINAL HYSTERECTOMY  age 47  . BREAST EXCISIONAL BIOPSY Right    2 benign biopsies right breast  . BREAST LUMPECTOMY Right 04/1998   Dr Precious Byrd, Benign Right breast disease  . OPEN REDUCTION INTERNAL FIXATION (ORIF) DISTAL RADIAL FRACTURE Right 11/01/2016   Procedure: OPEN REDUCTION INTERNAL FIXATION (ORIF) RIGHT DISTAL RADIUS  FRACTURE;  Surgeon: Penny Kos, MD;  Location: [facility];  Service: Orthopedics;  Laterality: Right;  . PATELLA FRACTURE SURGERY     right  . TONSILLECTOMY  2000    Current Outpatient Medications  Medication Sig Dispense Refill  .  aspirin EC 81 MG tablet Take 1 tablet (81 mg total) by mouth daily. 90 tablet 3  . estradiol (ESTRACE) 1 MG tablet Take 1 tablet by mouth daily.    . metoprolol succinate (TOPROL-XL) 50 MG 24 hr tablet TAKE ONE TABLET BY MOUTH EVERY DAY WITH OR IMMEDIATELY FOLLOWING SUPPER 90 tablet 0  . Omega-3 Fatty Acids (FISH OIL) 1200 MG CAPS Take 1,200 mg by mouth daily.     Marland Kitchen omeprazole (PRILOSEC) 20 MG capsule Take 20 mg by mouth daily.    Marland Kitchen telmisartan (MICARDIS) 40 MG tablet Take 40 mg by mouth daily.     No current facility-administered medications for this visit.   Allergies:  Patient has no known allergies.   ROS:   No lightheadedness or syncope.  Physical Exam: VS:  BP 132/62   Pulse 65   Temp (!) 97.4 F (36.3 C)   Ht 5\' 3"  (1.6 m)   Wt 186 lb (84.4 kg)   SpO2 97%   BMI 32.95 kg/m , BMI Body mass index is 32.95 kg/m.  Wt Readings from Last 3 Encounters:  10/16/19 186 lb (84.4 kg)  10/10/17 179 lb (81.2 kg)  11/01/16 176 lb (79.8 kg)    General: Patient appears comfortable at rest. HEENT: Conjunctiva and lids normal, wearing a mask. Neck: Supple, no elevated JVP or carotid bruits, no thyromegaly. Lungs: Clear to auscultation, nonlabored breathing at rest. Cardiac: Regular rate and rhythm, no S3 or  significant systolic murmur. Abdomen: Soft, nontender, bowel sounds present. Extremities: No pitting edema, distal pulses 2+.  ECG:  An ECG dated 10/10/2017 was personally reviewed today and demonstrated:  Sinus rhythm.  Recent Labwork:  January 2021: TSH 1.02, hemoglobin 11.0, platelets 429, BUN 14, creatinine 0.52, potassium 4.6, AST 16, ALT 12, cholesterol 190, triglycerides 146, HDL 45, LDL 119  Other Studies Reviewed Today:  Echocardiogram 09/16/2010 [facility]): Borderline LVH with LVEF greater than 82%, grade 1 diastolic dysfunction, normal right ventricular contraction, mild left atrial enlargement, PFO with small left-to-right shunt, mild mitral annular calcification with trace  mitral regurgitation, trace tricuspid regurgitation, mild pulmonic regurgitation, no pericardial effusion.  Assessment and Plan:  1.  History of palpitations, currently without significant recurrence on Toprol-XL.  Most recent monitoring did show rare PVCs.  She has a very remotely documented history of atrial fibrillation that has not recurred.  ECG normal today.  Continue with observation.  2.  Essential hypertension, systolic in the 956O today.  She is on Micardis along with Toprol-XL.  Keep follow-up with Dr. Nevada Byrd.  3.  Incidentally noted PFO by prior evaluation, small left to right shunt.  She remains on low-dose aspirin.  Medication Adjustments/Labs and Tests Ordered: Current medicines are reviewed at length with the patient today.  Concerns regarding medicines are outlined above.   Tests Ordered: Orders Placed This Encounter  Procedures  . EKG 12-Lead    Medication Changes: No orders of the defined types were placed in this encounter.   Disposition:  Follow up 1 year in the [place] office.  Signed, Satira Sark, MD, [facility] 10/16/2019 4:04 PM    [facility] at [facility] [address], [place], [place] [address] Phone: [phone]; Fax: [phone]

## 2019-12-23 ENCOUNTER — Other Ambulatory Visit: Payer: Self-pay | Admitting: Cardiology

## 2020-02-05 ENCOUNTER — Other Ambulatory Visit (HOSPITAL_COMMUNITY): Payer: Self-pay | Admitting: Internal Medicine

## 2020-02-05 DIAGNOSIS — Z1231 Encounter for screening mammogram for malignant neoplasm of breast: Secondary | ICD-10-CM

## 2020-04-07 ENCOUNTER — Other Ambulatory Visit: Payer: Self-pay | Admitting: Internal Medicine

## 2020-04-07 DIAGNOSIS — Z1231 Encounter for screening mammogram for malignant neoplasm of breast: Secondary | ICD-10-CM

## 2020-06-21 ENCOUNTER — Other Ambulatory Visit: Payer: Self-pay | Admitting: Cardiology

## 2020-06-22 ENCOUNTER — Ambulatory Visit
Admission: RE | Admit: 2020-06-22 | Discharge: 2020-06-22 | Disposition: A | Discharge status: Home / Self Care | Payer: PRIVATE HEALTH INSURANCE | Source: Ambulatory Visit | Attending: Internal Medicine | Admitting: Internal Medicine

## 2020-06-22 ENCOUNTER — Other Ambulatory Visit: Payer: Self-pay

## 2020-06-22 DIAGNOSIS — Z1231 Encounter for screening mammogram for malignant neoplasm of breast: Secondary | ICD-10-CM

## 2021-01-08 ENCOUNTER — Other Ambulatory Visit: Payer: Self-pay | Admitting: Cardiology

## 2021-02-15 ENCOUNTER — Other Ambulatory Visit: Payer: Self-pay | Admitting: Cardiology

## 2021-02-17 NOTE — Progress Notes (Signed)
Cardiology Office Note  Date: 02/18/2021   ID: Penny Byrd, DOB [birth year], MRN [id number]  PCP:  Benita Stabile, MD  Cardiologist:  Nona Dell, MD Electrophysiologist:  None   Chief Complaint: 1 year follow-up  History of Present Illness: Penny Byrd is a 60 y.o. female with a history of PAF, palpitations, PVCs, HTN, GERD, obesity.  She was last seen by Dr. Diona Byrd on 10/16/2019 for routine follow-up visit.  She had done very well without any palpitations on Toprol-XL.  Her most recent monitor showed rare PVCs.  Had remote history of documented atrial fibrillation that had not recurred.  EKG was normal on that visit.  Blood pressure systolic in the 130s.  She was continuing Micardis and Toprol-XL.  Previous noted PFO by prior evaluation with small left-to-right shunt.  She remained on low-dose aspirin.  Stated she had no major health changes.   She is here today for 1 year follow-up.  She denies any recent acute illnesses, hospitalizations, surgeries.  States she has been taking care of her elderly mother who has Parkinson's disease which is somewhat stressful for her.  She denies any recent palpitations or arrhythmias.  Blood pressure is well controlled on current therapy.  She denies any anginal or exertional symptoms, orthostatic symptoms, CVA or TIA-like symptoms, PND, orthopnea, bleeding.  Denies any claudication-like symptoms, DVT or PE-like symptoms, or lower extremity edema.  I reviewed her EKG which shows normal sinus rhythm with a rate of 62.  She states she recently saw her primary care provider and had labs drawn which were normal per her statement.  Past Medical History:  Diagnosis Date   Arthritis    Distal radius fracture, right    Essential hypertension    GERD (gastroesophageal reflux disease)    History of stress test 06/29/2008   Normal Myocardial perfusion imaging, No significant ischemia demonstrated this is a low risk scan, No prior study availiable  for comparison   Obesity    Paroxysmal atrial fibrillation (HCC)    PVC's (premature ventricular contractions)    a. rare PVCs on event monitor in 2017.    Past Surgical History:  Procedure Laterality Date   ABDOMINAL HYSTERECTOMY  age 10   BREAST EXCISIONAL BIOPSY Right    2 benign biopsies right breast   BREAST LUMPECTOMY Right 04/1998   Dr Penny Byrd, Benign Right breast disease   OPEN REDUCTION INTERNAL FIXATION (ORIF) DISTAL RADIAL FRACTURE Right 11/01/2016   Procedure: OPEN REDUCTION INTERNAL FIXATION (ORIF) RIGHT DISTAL RADIUS  FRACTURE;  Surgeon: Tarry Kos, MD;  Location: [facility];  Service: Orthopedics;  Laterality: Right;   PATELLA FRACTURE SURGERY     right   TONSILLECTOMY  2000    Current Outpatient Medications  Medication Sig Dispense Refill   aspirin EC 81 MG tablet Take 1 tablet (81 mg total) by mouth daily. 90 tablet 3   Calcium Carbonate-Vit D-Min (CALCIUM 1200 PO) Take 1 tablet by mouth daily.     estradiol (ESTRACE) 1 MG tablet Take 1 tablet by mouth daily.     metoprolol succinate (TOPROL-XL) 50 MG 24 hr tablet TAKE ONE TABLET BY MOUTH EVERY DAY WITH OR IMMEDIATELY FOLLOWING SUPPER 30 tablet 0   Omega-3 Fatty Acids (FISH OIL) 1200 MG CAPS Take 1,200 mg by mouth daily.      omeprazole (PRILOSEC) 20 MG capsule Take 20 mg by mouth daily.     telmisartan (MICARDIS) 40 MG tablet Take 40 mg by  mouth daily.     No current facility-administered medications for this visit.   Allergies:  Patient has no known allergies.   Social History: The patient  reports that she has never smoked. She has never used smokeless tobacco. She reports that she does not drink alcohol and does not use drugs.   Family History: The patient's family history includes Breast cancer in her paternal aunt; Cancer in her father; Healthy in her sister; Heart disease in her father; Heart failure in her mother; Hypertension in her brother.   ROS:  Please see the history of  present illness. Otherwise, complete review of systems is positive for none.  All other systems are reviewed and negative.   Physical Exam: VS:  BP 126/72   Pulse (!) 56   Ht 5\' 3"  (1.6 m)   Wt 185 lb 3.2 oz (84 kg)   SpO2 97%   BMI 32.81 kg/m , BMI Body mass index is 32.81 kg/m.  Wt Readings from Last 3 Encounters:  02/18/21 185 lb 3.2 oz (84 kg)  10/16/19 186 lb (84.4 kg)  10/10/17 179 lb (81.2 kg)    General: Patient appears comfortable at rest. Neck: Supple, no elevated JVP or carotid bruits, no thyromegaly. Lungs: Clear to auscultation, nonlabored breathing at rest. Cardiac: Regular rate and rhythm, no S3 or significant systolic murmur, no pericardial rub. Extremities: No pitting edema, distal pulses 2+. Skin: Warm and dry. Musculoskeletal: No kyphosis. Neuropsychiatric: Alert and oriented x3, affect grossly appropriate.  ECG: 02/18/2021 EKG normal sinus rhythm rate of 62.  Recent Labwork: No results found for requested labs within last 8760 hours.     Component Value Date/Time   CHOL 178 05/14/2015 0826   CHOL 179 01/31/2013 0755   TRIG 143 05/14/2015 0826   TRIG 116 01/31/2013 0755   HDL 38 (L) 05/14/2015 0826   HDL 36 (L) 01/31/2013 0755   CHOLHDL 4.7 05/14/2015 0826   VLDL 29 05/14/2015 0826   LDLCALC 111 05/14/2015 0826   LDLCALC 120 (H) 01/31/2013 0755    Other Studies Reviewed Today:    Echocardiogram 09/16/2010 [facility]): Borderline LVH with LVEF greater than 55%, grade 1 diastolic dysfunction, normal right ventricular contraction, mild left atrial enlargement, PFO with small left-to-right shunt, mild mitral annular calcification with trace mitral regurgitation, trace tricuspid regurgitation, mild pulmonic regurgitation, no pericardial effusion.   Assessment and Plan:  1. History of palpitations   2. Essential hypertension   3. PFO (patent foramen ovale)    1. History of palpitations Currently denies any palpitations.  10-year Toprol-XL 50 mg daily.   Continue aspirin 81 mg daily.  2. Essential hypertension Blood pressure well controlled today at 126/72.  Continue telmisartan 40 mg daily.  3. PFO (patent foramen ovale) History of PFO on prior echo 2012 with small left-to-right shunt.  She denies any symptoms.  Medication Adjustments/Labs and Tests Ordered: Current medicines are reviewed at length with the patient today.  Concerns regarding medicines are outlined above.   Disposition: Follow-up with Dr. 2013 or APP 1 year  Signed, Penny Browner, NP 02/18/2021 8:24 AM    [facility] at [facility] [address] [place], [place], [place] Phone: [phone]; Fax: [phone]

## 2021-02-18 ENCOUNTER — Ambulatory Visit: Payer: No Typology Code available for payment source | Admitting: Family Medicine

## 2021-02-18 ENCOUNTER — Encounter: Payer: Self-pay | Admitting: Family Medicine

## 2021-02-18 ENCOUNTER — Other Ambulatory Visit: Payer: Self-pay

## 2021-02-18 VITALS — BP 126/72 | HR 56 | Ht 63.0 in | Wt 185.2 lb

## 2021-02-18 DIAGNOSIS — Q211 Atrial septal defect: Secondary | ICD-10-CM

## 2021-02-18 DIAGNOSIS — I10 Essential (primary) hypertension: Secondary | ICD-10-CM

## 2021-02-18 DIAGNOSIS — Q2112 Patent foramen ovale: Secondary | ICD-10-CM

## 2021-02-18 DIAGNOSIS — Z87898 Personal history of other specified conditions: Secondary | ICD-10-CM

## 2021-02-18 MED ORDER — ASPIRIN EC 81 MG PO TBEC: 81.0000 mg | DELAYED_RELEASE_TABLET | Freq: Every day | ORAL | 3 refills | Status: AC

## 2021-02-18 MED ORDER — TELMISARTAN 40 MG PO TABS: 40.0000 mg | ORAL_TABLET | Freq: Every day | ORAL | 3 refills | Status: DC

## 2021-02-18 MED ORDER — METOPROLOL SUCCINATE ER 50 MG PO TB24: 50.0000 mg | ORAL_TABLET | Freq: Every day | ORAL | 3 refills | Status: DC

## 2021-02-18 NOTE — Patient Instructions (Addendum)

## 2021-03-09 ENCOUNTER — Other Ambulatory Visit: Payer: Self-pay

## 2021-03-09 ENCOUNTER — Ambulatory Visit
Admission: RE | Admit: 2021-03-09 | Discharge: 2021-03-09 | Disposition: A | Discharge status: Home / Self Care | Payer: No Typology Code available for payment source | Source: Ambulatory Visit | Attending: Physician Assistant | Admitting: Physician Assistant

## 2021-03-09 VITALS — BP 157/97 | HR 69 | Temp 98.0°F | Resp 18

## 2021-03-09 DIAGNOSIS — R319 Hematuria, unspecified: Secondary | ICD-10-CM

## 2021-03-09 DIAGNOSIS — N390 Urinary tract infection, site not specified: Secondary | ICD-10-CM | POA: Diagnosis not present

## 2021-03-09 LAB — POCT URINALYSIS DIP (MANUAL ENTRY)
Bilirubin, UA: NEGATIVE
Glucose, UA: NEGATIVE mg/dL
Ketones, POC UA: NEGATIVE mg/dL
Nitrite, UA: NEGATIVE
Protein Ur, POC: NEGATIVE mg/dL
Spec Grav, UA: >=1.03 — AB (ref 1.010–1.025)
Urobilinogen, UA: 0.2 E.U./dL
pH, UA: 5.5 (ref 5.0–8.0)

## 2021-03-09 MED ORDER — PHENAZOPYRIDINE HCL 200 MG PO TABS: 200.0000 mg | ORAL_TABLET | Freq: Three times a day (TID) | ORAL | 0 refills | Status: AC | PRN

## 2021-03-09 MED ORDER — CEPHALEXIN 500 MG PO CAPS: 500.0000 mg | ORAL_CAPSULE | Freq: Four times a day (QID) | ORAL | 0 refills | Status: AC

## 2021-03-09 NOTE — ED Triage Notes (Signed)
Pt presents with concern for UTI.  Noticed frequency yesterday then overnight started hematuria, dysuria and lower abdominal pressure.  No noticeable relief with Tylenol at 0330 (last dose).  Reports generalized lower back pain 2-3 days ago.

## 2021-03-14 NOTE — ED Provider Notes (Signed)
[facility]    CSN: [id number] Arrival date & time: 03/09/21  1340      History   Chief Complaint Chief Complaint  Patient presents with   Hematuria    HPI Penny Byrd is a 60 y.o. female.   Pt complains of dysuria and blood in urine.    The history is provided by the patient. No language interpreter was used.  Hematuria This is a new problem. The current episode started yesterday. The problem occurs constantly. The problem has not changed since onset.Nothing relieves the symptoms. She has tried nothing for the symptoms. The treatment provided no relief.   Past Medical History:  Diagnosis Date   Arthritis    Distal radius fracture, right    Essential hypertension    GERD (gastroesophageal reflux disease)    History of stress test 06/29/2008   Normal Myocardial perfusion imaging, No significant ischemia demonstrated this is a low risk scan, No prior study availiable for comparison   Obesity    Paroxysmal atrial fibrillation (HCC)    PVC's (premature ventricular contractions)    a. rare PVCs on event monitor in 2017.    Patient Active Problem List   Diagnosis Date Noted   Closed fracture of right distal radius and ulna 11/01/2016   Obesity (BMI 30.0-34.9) 02/04/2013   Dyslipidemia 02/04/2013   Essential hypertension 02/04/2013   PFO (patent foramen ovale) 02/04/2013   Palpitations 02/04/2013   Swelling of skeletal muscle 10/10/2011   Pain in thoracic spine 10/10/2011    Past Surgical History:  Procedure Laterality Date   ABDOMINAL HYSTERECTOMY  age 73   BREAST EXCISIONAL BIOPSY Right    2 benign biopsies right breast   BREAST LUMPECTOMY Right 04/1998   Dr Precious Reel, Benign Right breast disease   OPEN REDUCTION INTERNAL FIXATION (ORIF) DISTAL RADIAL FRACTURE Right 11/01/2016   Procedure: OPEN REDUCTION INTERNAL FIXATION (ORIF) RIGHT DISTAL RADIUS  FRACTURE;  Surgeon: Tarry Kos, MD;  Location: [facility];  Service:  Orthopedics;  Laterality: Right;   PATELLA FRACTURE SURGERY     right   TONSILLECTOMY  2000    OB History   No obstetric history on file.      Home Medications    Prior to Admission medications   Medication Sig Start Date End Date Taking? Authorizing Provider  aspirin EC 81 MG tablet Take 1 tablet (81 mg total) by mouth daily. 02/18/21  Yes Netta Neat., NP  Calcium Carbonate-Vit D-Min (CALCIUM 1200 PO) Take 1 tablet by mouth daily.   Yes [provider]  cephALEXin (KEFLEX) 500 MG capsule Take 1 capsule (500 mg total) by mouth 4 (four) times daily for 10 days. 03/09/21 03/19/21 Yes Cheron Schaumann K, PA-C  cephALEXin (KEFLEX) 500 MG capsule Take 1 capsule (500 mg total) by mouth 4 (four) times daily for 10 days. 03/09/21 03/19/21 Yes Elson Areas, PA-C  estradiol (ESTRACE) 1 MG tablet Take 1 tablet by mouth daily. 04/11/14  Yes [provider]  metoprolol succinate (TOPROL-XL) 50 MG 24 hr tablet Take 1 tablet (50 mg total) by mouth daily. 02/18/21  Yes Netta Neat., NP  Omega-3 Fatty Acids (FISH OIL) 1200 MG CAPS Take 1,200 mg by mouth daily.    Yes [provider]  omeprazole (PRILOSEC) 20 MG capsule Take 20 mg by mouth daily.   Yes [provider]  phenazopyridine (PYRIDIUM) 200 MG tablet Take 1 tablet (200 mg total) by mouth 3 (three)  times daily as needed for pain. 03/09/21 03/09/22 Yes Cheron Schaumann K, PA-C  telmisartan (MICARDIS) 40 MG tablet Take 1 tablet (40 mg total) by mouth daily. 02/18/21  Yes Netta Neat., NP    Family History Family History  Problem Relation Age of Onset   Heart failure Mother    Cancer Father    Heart disease Father    Healthy Sister    Hypertension Brother    Breast cancer Paternal Aunt     Social History Social History   Tobacco Use   Smoking status: Never   Smokeless tobacco: Never  Vaping Use   Vaping Use: Never used  Substance Use Topics   Alcohol use: No    Alcohol/week: 0.0 standard  drinks   Drug use: No     Allergies   Patient has no known allergies.   Review of Systems Review of Systems  Genitourinary:  Positive for dysuria and hematuria.  All other systems reviewed and are negative.   Physical Exam Triage Vital Signs ED Triage Vitals [03/09/21 1359]  Enc Vitals Group     BP (!) 157/97     Pulse Rate 69     Resp 18     Temp 98 F (36.7 C)     Temp Source Oral     SpO2 96 %     Weight      Height      Head Circumference      Peak Flow      Pain Score 0     Pain Loc      Pain Edu?      Excl. in GC?    No data found.  Updated Vital Signs BP (!) 157/97 (BP Location: Right Arm)   Pulse 69   Temp 98 F (36.7 C) (Oral)   Resp 18   SpO2 96%   Visual Acuity Right Eye Distance:   Left Eye Distance:   Bilateral Distance:    Right Eye Near:   Left Eye Near:    Bilateral Near:     Physical Exam Vitals and nursing note reviewed.  Constitutional:      Appearance: She is well-developed.  HENT:     Head: Normocephalic.  Pulmonary:     Effort: Pulmonary effort is normal.  Abdominal:     General: There is no distension.  Musculoskeletal:        General: Normal range of motion.     Cervical back: Normal range of motion.  Neurological:     Mental Status: She is alert and oriented to person, place, and time.     UC Treatments / Results  Labs (all labs ordered are listed, but only abnormal results are displayed) Labs Reviewed  POCT URINALYSIS DIP (MANUAL ENTRY) - Abnormal; Notable for the following components:      Result Value   Spec Grav, UA >=1.030 (*)    Blood, UA moderate (*)    Leukocytes, UA Small (1+) (*)    All other components within normal limits    EKG   Radiology No results found.  Procedures Procedures (including critical care time)  Medications Ordered in UC Medications - No data to display  Initial Impression / Assessment and Plan / UC Course  I have reviewed the triage vital signs and the nursing  notes.  Pertinent labs & imaging results that were available during my care of the patient were reviewed by me and considered in my medical decision making (see chart  for details).     MDM:  Pt counseled on uti and medication.   Final Clinical Impressions(s) / UC Diagnoses   Final diagnoses:  Urinary tract infection with hematuria, site unspecified   Discharge Instructions   None    ED Prescriptions     Medication Sig Dispense Auth. Provider   cephALEXin (KEFLEX) 500 MG capsule Take 1 capsule (500 mg total) by mouth 4 (four) times daily for 10 days. 28 capsule Tosca Pletz K, [place]   cephALEXin (KEFLEX) 500 MG capsule Take 1 capsule (500 mg total) by mouth 4 (four) times daily for 10 days. 28 capsule Jazari Ober K, [place]   phenazopyridine (PYRIDIUM) 200 MG tablet Take 1 tablet (200 mg total) by mouth 3 (three) times daily as needed for pain. 20 tablet Elson Areas, [place]      PDMP not reviewed this encounter. An After Visit Summary was printed and given to the patient.    Elson Areas, [place] 03/14/21 [phone]

## 2021-09-05 ENCOUNTER — Other Ambulatory Visit: Payer: Self-pay | Admitting: Obstetrics & Gynecology

## 2021-09-05 DIAGNOSIS — R928 Other abnormal and inconclusive findings on diagnostic imaging of breast: Secondary | ICD-10-CM

## 2021-09-22 ENCOUNTER — Ambulatory Visit
Admission: RE | Admit: 2021-09-22 | Discharge: 2021-09-22 | Disposition: A | Discharge status: Home / Self Care | Payer: No Typology Code available for payment source | Source: Ambulatory Visit | Attending: Obstetrics & Gynecology | Admitting: Obstetrics & Gynecology

## 2021-09-22 ENCOUNTER — Ambulatory Visit: Payer: No Typology Code available for payment source

## 2021-09-22 DIAGNOSIS — R928 Other abnormal and inconclusive findings on diagnostic imaging of breast: Secondary | ICD-10-CM

## 2022-02-27 ENCOUNTER — Other Ambulatory Visit: Payer: Self-pay

## 2022-02-27 MED ORDER — METOPROLOL SUCCINATE ER 50 MG PO TB24: 50.0000 mg | ORAL_TABLET | Freq: Every day | ORAL | 0 refills | Status: DC

## 2022-04-03 ENCOUNTER — Other Ambulatory Visit: Payer: Self-pay

## 2022-04-03 MED ORDER — TELMISARTAN 40 MG PO TABS: 40.0000 mg | ORAL_TABLET | Freq: Every day | ORAL | 0 refills | Status: DC

## 2022-05-22 NOTE — Progress Notes (Unsigned)
Cardiology Office Note  Date: 05/23/2022   ID: TRU LEOPARD, DOB December 24, 1960, MRN [id number]  PCP:  Benita Stabile, MD  Cardiologist:  Nona Dell, MD Electrophysiologist:  None   Chief Complaint  Patient presents with   Cardiac follow-up    History of Present Illness: Penny Byrd is a 61 y.o. female last seen in August 2022 by Mr. Vincenza Hews NP.  She is here for a routine visit.  Reports no interval exertional symptoms and stable NYHA class II dyspnea.  Continues to feel brief episodes of palpitations, feeling of anxiety.  Concurrently however she is under a lot of stress as primary caregiver for her elderly mother.  She is also working full-time.  She has had no sudden unexplained syncope.  She does have a history of small PFO by echocardiogram done through [facility] back in 2012.  We discussed getting an updated echocardiogram.  I went over her medications which are outlined below.  I personally reviewed her ECG today which shows normal sinus rhythm.  Past Medical History:  Diagnosis Date   Arthritis    Distal radius fracture, right    Essential hypertension    GERD (gastroesophageal reflux disease)    History of stress test 06/29/2008   Normal Myocardial perfusion imaging, No significant ischemia demonstrated this is a low risk scan, No prior study availiable for comparison   Obesity    Paroxysmal atrial fibrillation (HCC)    PVC's (premature ventricular contractions)    a. rare PVCs on event monitor in 2017.    Past Surgical History:  Procedure Laterality Date   ABDOMINAL HYSTERECTOMY  age 44   BREAST EXCISIONAL BIOPSY Right    2 benign biopsies right breast   BREAST LUMPECTOMY Right 04/1998   Dr Precious Reel, Benign Right breast disease   OPEN REDUCTION INTERNAL FIXATION (ORIF) DISTAL RADIAL FRACTURE Right 11/01/2016   Procedure: OPEN REDUCTION INTERNAL FIXATION (ORIF) RIGHT DISTAL RADIUS  FRACTURE;  Surgeon: Tarry Kos, MD;  Location: [facility];   Service: Orthopedics;  Laterality: Right;   PATELLA FRACTURE SURGERY     right   TONSILLECTOMY  2000    Current Outpatient Medications  Medication Sig Dispense Refill   aspirin EC 81 MG tablet Take 1 tablet (81 mg total) by mouth daily. 90 tablet 3   Calcium Carbonate-Vit D-Min (CALCIUM 1200 PO) Take 1 tablet by mouth daily.     estradiol (ESTRACE) 1 MG tablet Take 1 tablet by mouth daily.     metoprolol succinate (TOPROL-XL) 50 MG 24 hr tablet Take 1 tablet (50 mg total) by mouth daily. 90 tablet 0   Omega-3 Fatty Acids (FISH OIL) 1200 MG CAPS Take 1,200 mg by mouth daily.      pantoprazole (PROTONIX) 40 MG tablet Take 40 mg by mouth daily.     telmisartan (MICARDIS) 40 MG tablet Take 1 tablet (40 mg total) by mouth daily. 30 tablet 0   No current facility-administered medications for this visit.   Allergies:  Patient has no known allergies.   ROS: No orthopnea or PND.  Physical Exam: VS:  BP 122/78   Pulse 60   Ht 5\' 1"  (1.549 m)   Wt 183 lb 6.4 oz (83.2 kg)   SpO2 98%   BMI 34.65 kg/m , BMI Body mass index is 34.65 kg/m.  Wt Readings from Last 3 Encounters:  05/23/22 183 lb 6.4 oz (83.2 kg)  02/18/21 185 lb 3.2 oz (84 kg)  10/16/19 186  lb (84.4 kg)    General: Patient appears comfortable at rest. HEENT: Conjunctiva and lids normal, oropharynx clear. Neck: Supple, no elevated JVP or carotid bruits, no thyromegaly. Lungs: Clear to auscultation, nonlabored breathing at rest. Cardiac: Regular rate and rhythm, no S3 or significant systolic murmur, no pericardial rub. Abdomen: Soft, nontender, bowel sounds present. Extremities: No pitting edema.  ECG:  An ECG dated 02/18/2021 was personally reviewed today and demonstrated:  Sinus rhythm.  Recent Labwork:  January 2021: TSH 1.02, hemoglobin 11.0, platelets 429, BUN 14, creatinine 0.52, potassium 4.6, AST 16, ALT 12, cholesterol 190, triglycerides 146, HDL 45, LDL 119  Other Studies Reviewed Today:  No interval cardiac  testing for review today.  Assessment and Plan:  1.  History of palpitations with very remotely documented atrial fibrillation but no recurrence.  Interval cardiac monitoring showed only PVCs.  Her ECG is normal today.  We discussed symptom surveillance, if she becomes aware of more persistent or frequent palpitations I would suggest a follow-up ZIO monitor.  For now continue Toprol-XL and observation.  2.  Small PFO by remote echocardiogram through [facility] back in 2012.  We will obtain a follow-up echocardiogram with bubble study for surveillance.  No significant murmur and ECG normal.  3.  Essential hypertension, blood pressure is well controlled today.  She remains on Micardis and has continued follow-up with Dr. Nevada Crane  Medication Adjustments/Labs and Tests Ordered: Current medicines are reviewed at length with the patient today.  Concerns regarding medicines are outlined above.   Tests Ordered: Orders Placed This Encounter  Procedures   EKG 12-Lead    Medication Changes: No orders of the defined types were placed in this encounter.   Disposition:  Follow up  1 year.  Signed, Satira Sark, MD, [facility] 05/23/2022 8:49 AM    [place] [facility] [place]. [address], [place], [place] [address] Phone: [phone]; Fax: [phone]

## 2022-05-23 ENCOUNTER — Encounter: Payer: Self-pay | Admitting: Cardiology

## 2022-05-23 ENCOUNTER — Ambulatory Visit: Payer: No Typology Code available for payment source | Attending: Cardiology | Admitting: Cardiology

## 2022-05-23 VITALS — BP 122/78 | HR 60 | Ht 61.0 in | Wt 183.4 lb

## 2022-05-23 DIAGNOSIS — I10 Essential (primary) hypertension: Secondary | ICD-10-CM | POA: Diagnosis not present

## 2022-05-23 DIAGNOSIS — Q2112 Patent foramen ovale: Secondary | ICD-10-CM | POA: Diagnosis not present

## 2022-05-23 DIAGNOSIS — Z87898 Personal history of other specified conditions: Secondary | ICD-10-CM | POA: Diagnosis not present

## 2022-05-23 NOTE — Patient Instructions (Signed)
Medication Instructions:  Your physician recommends that you continue on your current medications as directed. Please refer to the Current Medication list given to you today.   Labwork: None today  Testing/Procedures: Your physician has requested that you have an echocardiogram with bubble study. Echocardiography is a painless test that uses sound waves to create images of your heart. It provides your doctor with information about the size and shape of your heart and how well your heart's chambers and valves are working. This procedure takes approximately one hour. There are no restrictions for this procedure. Please do NOT wear cologne, perfume, aftershave, or lotions (deodorant is allowed). Please arrive 15 minutes prior to your appointment time.   Follow-Up: 1 year  Any Other Special Instructions Will Be Listed Below (If Applicable).  If you need a refill on your cardiac medications before your next appointment, please call your pharmacy.

## 2022-05-27 ENCOUNTER — Other Ambulatory Visit: Payer: Self-pay | Admitting: Cardiology

## 2022-05-31 ENCOUNTER — Ambulatory Visit (HOSPITAL_COMMUNITY)
Admission: RE | Admit: 2022-05-31 | Discharge: 2022-05-31 | Disposition: A | Discharge status: Home / Self Care | Payer: No Typology Code available for payment source | Source: Ambulatory Visit | Attending: Cardiology | Admitting: Cardiology

## 2022-05-31 DIAGNOSIS — Q2112 Patent foramen ovale: Secondary | ICD-10-CM

## 2022-05-31 NOTE — Addendum Note (Signed)
Encounter addended by: Carolyne Fiscal on: 05/31/2022 4:09 PM  Actions taken: Imaging Exam ended, Clinical Note Signed

## 2022-05-31 NOTE — Progress Notes (Signed)
*  PRELIMINARY RESULTS* Echocardiogram 2D Echocardiogram has been performed.  Carolyne Fiscal 05/31/2022, 4:08 PM

## 2022-06-02 LAB — ECHOCARDIOGRAM COMPLETE BUBBLE STUDY
AR max vel: 2.58 cm2
AV Area VTI: 2.99 cm2
AV Area mean vel: 2.76 cm2
AV Mean grad: 5 mmHg
AV Peak grad: 11.3 mmHg
Ao pk vel: 1.68 m/s
Area-P 1/2: 2.73 cm2
MV VTI: 3.01 cm2
S' Lateral: 2.6 cm

## 2022-12-06 LAB — COLOGUARD: COLOGUARD: NEGATIVE

## 2022-12-06 LAB — EXTERNAL GENERIC LAB PROCEDURE: COLOGUARD: NEGATIVE

## 2023-05-29 ENCOUNTER — Encounter: Payer: Self-pay | Admitting: Student

## 2023-05-29 ENCOUNTER — Ambulatory Visit: Payer: No Typology Code available for payment source | Attending: Student | Admitting: Student

## 2023-05-29 VITALS — BP 96/62 | HR 52 | Ht 61.0 in | Wt 168.0 lb

## 2023-05-29 DIAGNOSIS — R002 Palpitations: Secondary | ICD-10-CM | POA: Diagnosis not present

## 2023-05-29 DIAGNOSIS — I10 Essential (primary) hypertension: Secondary | ICD-10-CM | POA: Diagnosis not present

## 2023-05-29 DIAGNOSIS — Q2112 Patent foramen ovale: Secondary | ICD-10-CM | POA: Diagnosis not present

## 2023-05-29 MED ORDER — METOPROLOL SUCCINATE ER 50 MG PO TB24: 50.0000 mg | ORAL_TABLET | Freq: Every day | ORAL | 3 refills | Status: DC

## 2023-05-29 MED ORDER — TELMISARTAN 20 MG PO TABS: 20.0000 mg | ORAL_TABLET | Freq: Every day | ORAL | 3 refills | Status: DC

## 2023-05-29 NOTE — Progress Notes (Signed)
Cardiology Office Note    Date:  05/29/2023  ID:  LATRELL HASELEY, DOB [birth year], MRN [id number] Cardiologist: Nona Dell, MD    History of Present Illness:    Penny Byrd is a 62 y.o. female with past medical history of palpitations (PVC's by prior monitor), PFO, HTN and GERD who presents to the office today for annual follow-up.  She was examined by Dr. Diona Browner in 05/2022 and reported having NYHA Class II dyspnea and brief palpitations but no specific chest pain. She was continued on her current dose of Toprol-XL with plans for a follow-up ZIO monitor if palpitations increased in frequency or severity. Given the timeframe since last evaluation of her PFO, a follow-up echocardiogram with bubble study was recommended for further evaluation. This showed a hyperdynamic EF of 70 to 75% with no regional wall motion abnormalities. Saline contrast bubble study was negative with no evidence of interatrial shunt.  In talking with the patient today, she reports overall feeling well since her last office visit. She continues to work full-time and also stays active in doing chores around the house and walking her dog. She denies any recent chest pain or dyspnea on exertion with these activities. No recent orthopnea, PND or pitting edema. Reports her palpitations have overall been well-controlled. She has been on a weight loss program and lost over 18 pounds within the past month with dietary changes. BP is soft today but she denies any associated dizziness or presyncope. She is planning to travel to [place] later this month to celebrate her 40th wedding anniversary.  Studies Reviewed:   EKG: EKG is ordered today and demonstrates:   EKG Interpretation Date/Time:  Tuesday May 29 2023 15:12:32 EST Ventricular Rate:  54 PR Interval:  172 QRS Duration:  86 QT Interval:  420 QTC Calculation: 398 R Axis:   54  Text Interpretation: Sinus bradycardia Nonspecific T wave abnormality along  inferior and lateral leads. Confirmed by Penny Byrd ([id number]) on 05/29/2023 3:23:07 PM       Echocardiogram: 05/2022 IMPRESSIONS     1. Left ventricular ejection fraction, by estimation, is 70 to 75%. The  left ventricle has hyperdynamic function. The left ventricle has no  regional wall motion abnormalities. Left ventricular diastolic parameters  were normal.   2. Right ventricular systolic function is normal. The right ventricular  size is normal. Tricuspid regurgitation signal is inadequate for assessing  PA pressure.   3. Left atrial size was mildly dilated.   4. The mitral valve is abnormal. Mild mitral valve regurgitation. No  evidence of mitral stenosis.   5. The tricuspid valve is abnormal.   6. The aortic valve is calcified. Aortic valve regurgitation is not  visualized. No aortic stenosis is present.   7. The inferior vena cava is normal in size with greater than 50%  respiratory variability, suggesting right atrial pressure of 3 mmHg.   8. Agitated saline contrast bubble study was negative, with no evidence  of any interatrial shunt.    Physical Exam:   VS:  BP 96/62   Pulse (!) 52   Ht 5\' 1"  (1.549 m)   Wt 168 lb (76.2 kg)   SpO2 95%   BMI 31.74 kg/m    Wt Readings from Last 3 Encounters:  05/29/23 168 lb (76.2 kg)  05/23/22 183 lb 6.4 oz (83.2 kg)  02/18/21 185 lb 3.2 oz (84 kg)     GEN: Pleasant female appearing in no acute distress NECK: No JVD; No  carotid bruits CARDIAC: RRR, no murmurs, rubs, gallops RESPIRATORY:  Clear to auscultation without rales, wheezing or rhonchi  ABDOMEN: Appears non-distended. No obvious abdominal masses. EXTREMITIES: No clubbing or cyanosis. No pitting edema.  Distal pedal pulses are 2+ bilaterally.   Assessment and Plan:   1. Palpitations - She reports symptoms have overall been well-controlled. She is in NSR today by examination and EKG. EKG does show slight T wave abnormalities along the inferior and lateral  leads but no diagnostic changes and overall similar to prior tracings. Continue Toprol-XL 50 mg daily.  2. History of PFO - Most recent echocardiogram in 05/2022 showed no evidence of interatrial shunting. No indication for further testing at this time.  3. HTN - BP is at 96/62 during today's visit with similar values by recheck. Suspect this is due to her recent intentional weight loss. She is currently on Toprol-XL 50 mg daily and Telmisartan 40 mg daily. Will reduce Telmisartan to 20 mg daily. I encouraged her to follow readings at home and report back if this remains soft as we may ultimately need to discontinue Telmisartan if she has continued weight loss.  Signed, Ellsworth Lennox, PA-C

## 2023-05-29 NOTE — Patient Instructions (Signed)
Medication Instructions:  DECREASE Telmisartan to 20 mg daily  Labwork: None today  Testing/Procedures: None today  Follow-Up: 1 year Dr.McDowell  Any Other Special Instructions Will Be Listed Below (If Applicable).  If you need a refill on your cardiac medications before your next appointment, please call your pharmacy.

## 2024-01-24 IMAGING — MG MM DIGITAL DIAGNOSTIC UNILAT*R* W/ TOMO W/ CAD
4 series · 4 of 12 positions shown · non-contrast
Comparison: Previous exam(s).

CLINICAL DATA: Patient returns after screening study for evaluation
possible RIGHT breast mass.

EXAM:
DIGITAL DIAGNOSTIC UNILATERAL RIGHT MAMMOGRAM WITH TOMOSYNTHESIS AND
CAD
TECHNIQUE: Right digital diagnostic mammography and breast tomosynthesis was
performed. The images were evaluated with computer-aided detection.

[R CC synth-2D]
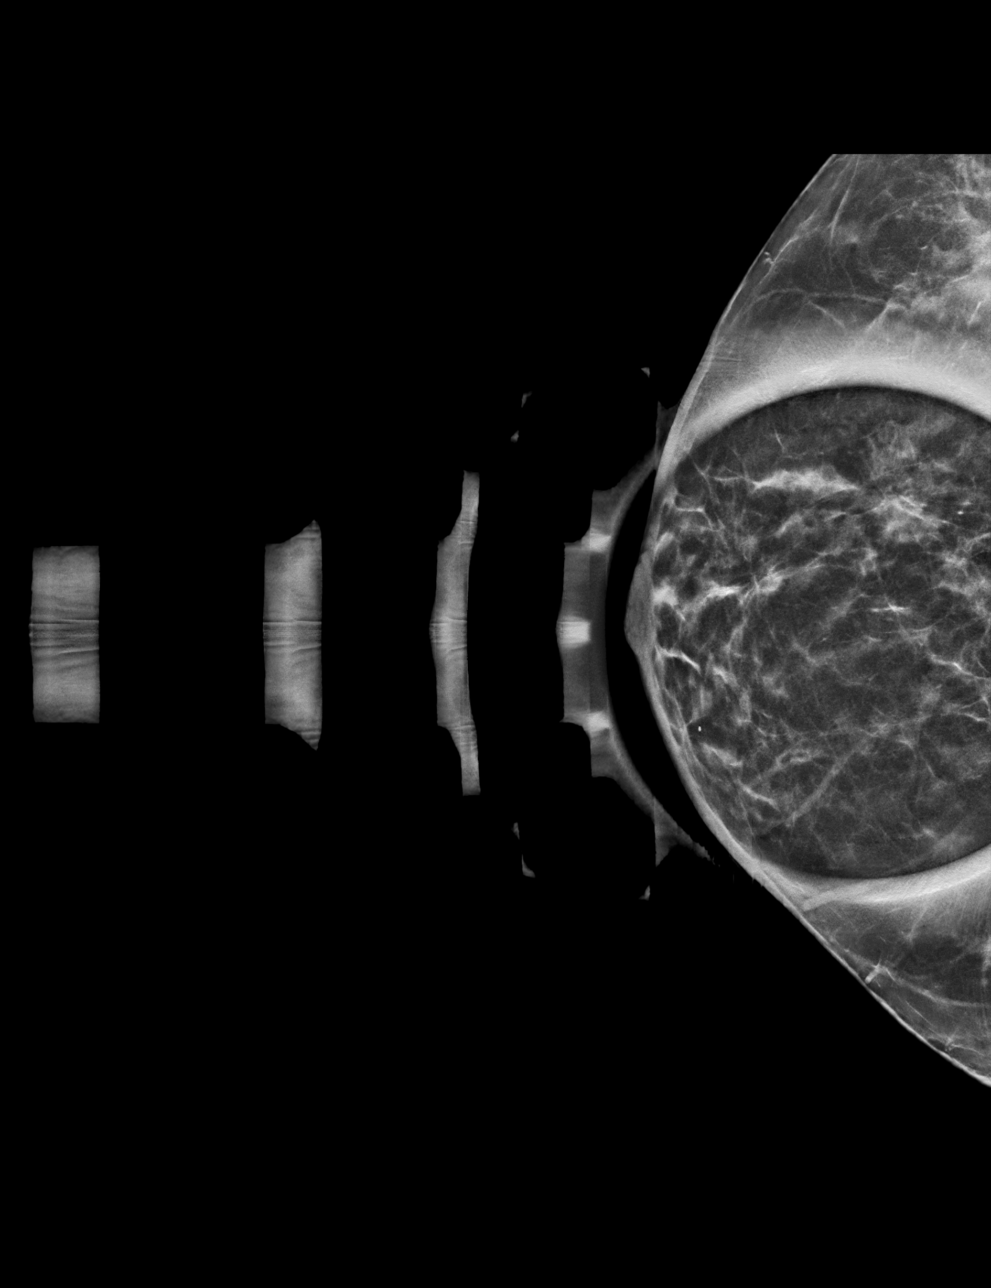

[R MLO synth-2D]
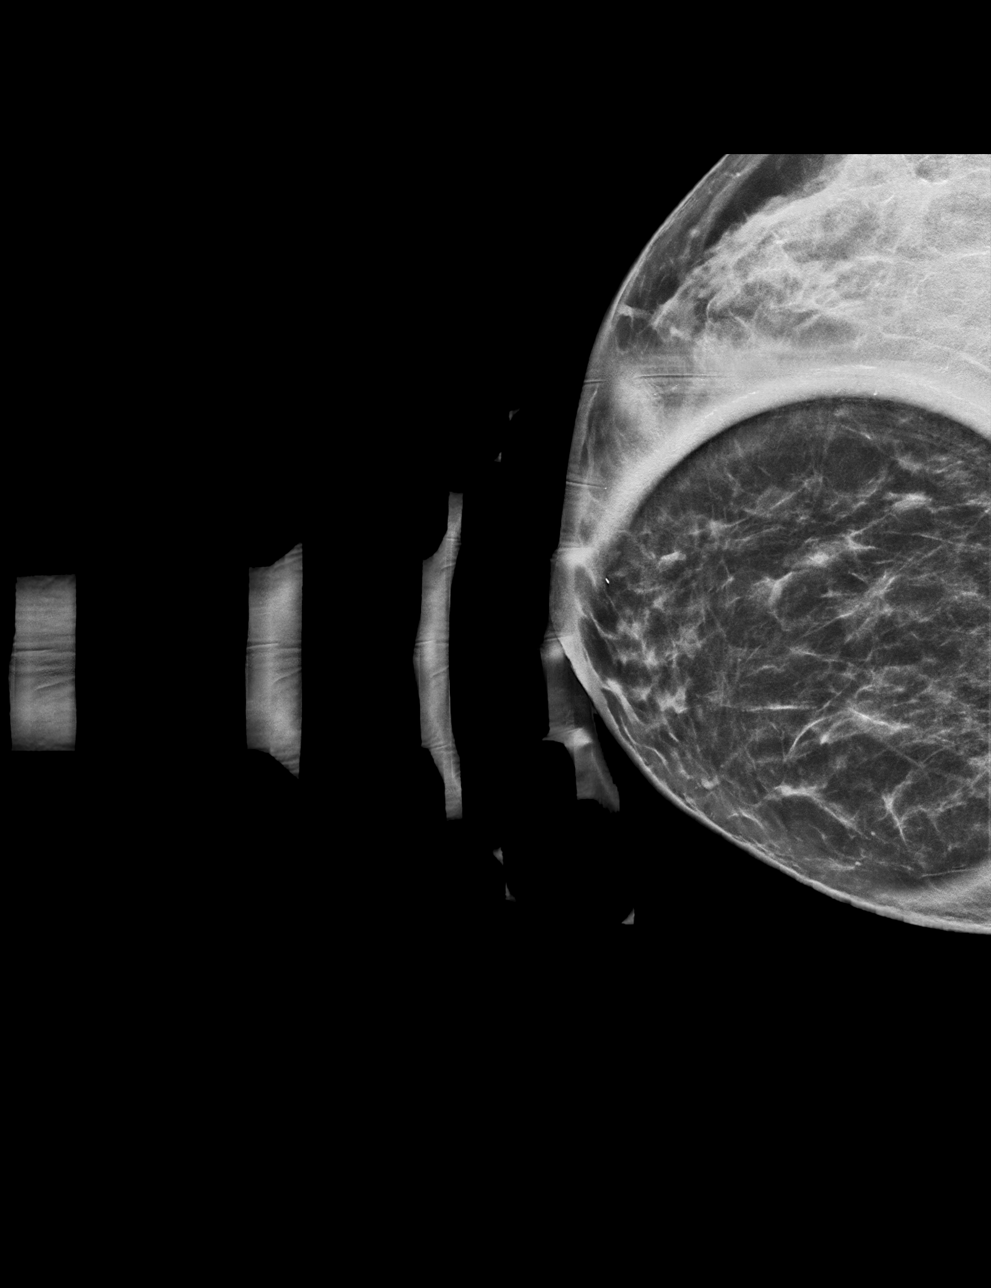

[R CC tomo · tomo slice 23/44.0]
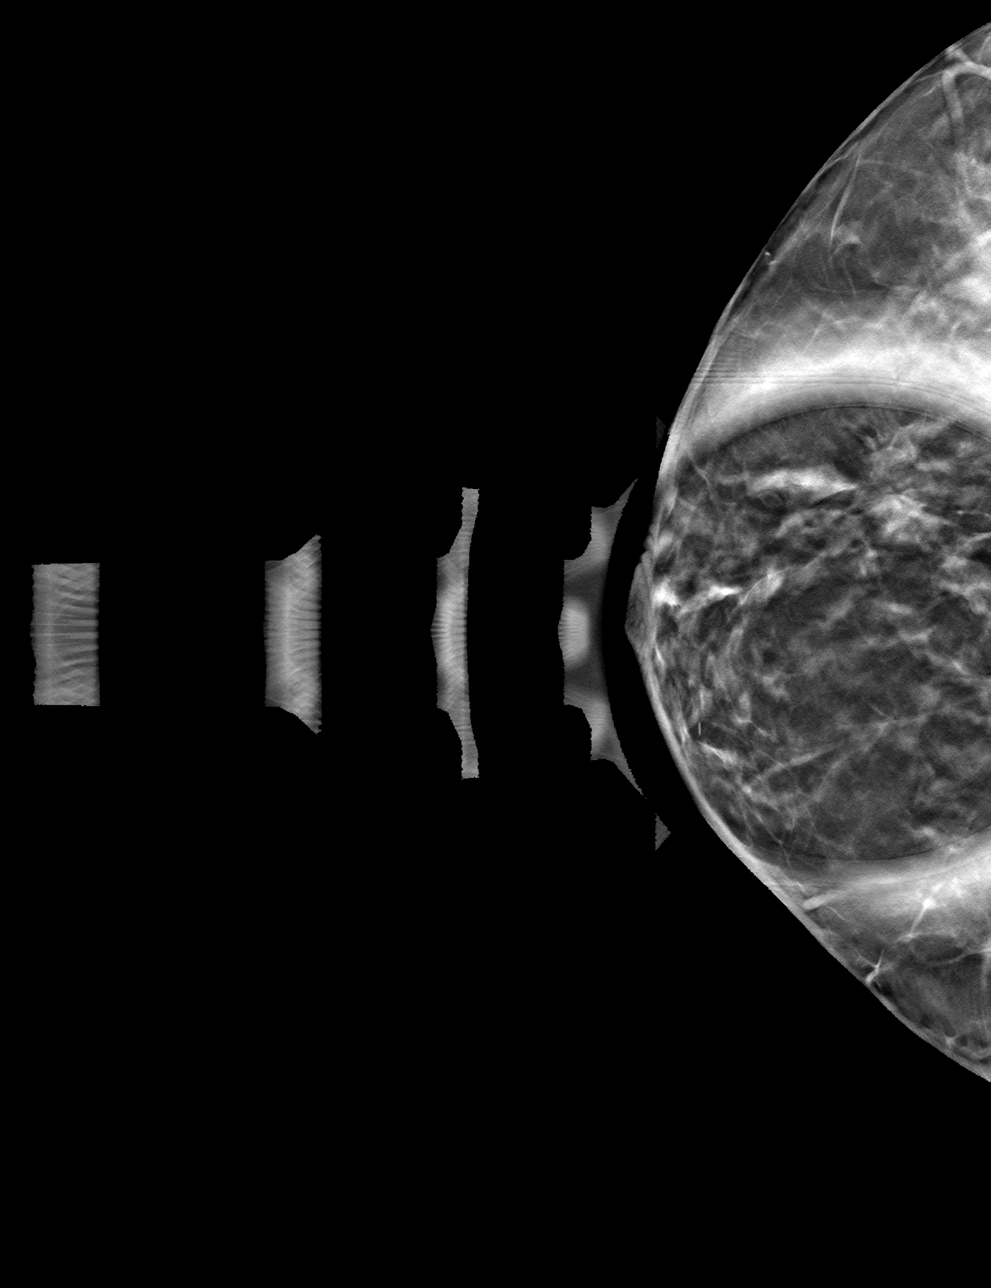

[R MLO tomo · tomo slice 27/53.0]
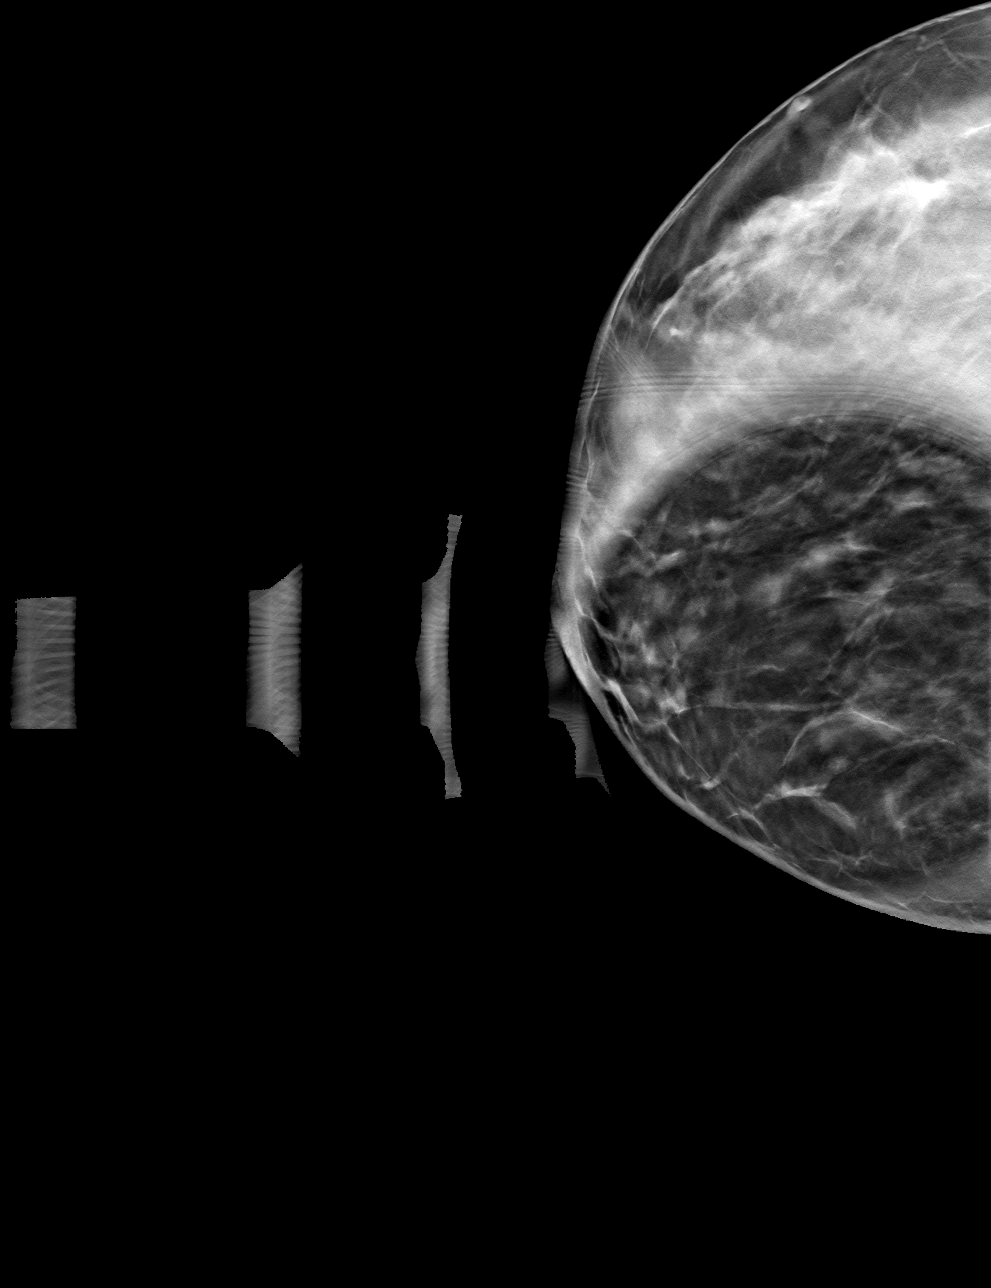

[4 of 12 positions shown; findings below may reference images not displayed]

ACR Breast Density Category c: The breast tissue is heterogeneously
dense, which may obscure small masses.
FINDINGS: Additional 2-D and 3-D images are performed. These views show no
persistent mass in the anterior portion of the RIGHT breast. No
suspicious mass, distortion, or microcalcifications are identified
to suggest presence of malignancy.
IMPRESSION: No mammographic evidence for malignancy.

RECOMMENDATION:
Screening mammogram in one year.(Code:[id number]-S-GG6)

I have discussed the findings and recommendations with the patient.
If applicable, a reminder letter will be sent to the patient
regarding the next appointment.

BI-RADS CATEGORY  1: Negative.

## 2024-06-02 ENCOUNTER — Other Ambulatory Visit: Payer: Self-pay | Admitting: Student

## 2024-06-17 ENCOUNTER — Other Ambulatory Visit: Payer: Self-pay | Admitting: Student

## 2024-07-21 ENCOUNTER — Other Ambulatory Visit: Payer: Self-pay | Admitting: Cardiology

## 2024-08-03 NOTE — Progress Notes (Unsigned)
 "  Cardiology Office Note    Date:  08/05/2024  ID:  Penny Byrd, Penny Byrd 06/29/1961, MRN [id number] Cardiologist: Jayson Sierras, MD Cardiology APP:  Johnson Laymon HERO, PA-C { :  History of Present Illness:    Penny Byrd is a 64 y.o. female with past medical history of palpitations (PVC's by prior monitor), HTN and GERD who presents to the office today for annual follow-up.  She was last examined by myself in 05/2023 and denied any recent anginal symptoms and her palpitations had improved since the time of her last office visit. She had also lost over 18 pounds within the past month with dietary changes. She was continued on Toprol -XL 50 mg daily but given her BP at 96/62, Telmisartan  was reduced from 40 mg daily to 20 mg daily and she was encouraged to track BP at home.  In talking with the patient today, she reports overall doing well since her last office visit. She walks on the treadmill several days a week and also walks around her farm with her dog. She denies any recent chest pain or dyspnea on exertion. Palpitations have overall been well-controlled as well. No specific orthopnea, PND or pitting edema. She did try to donate blood within the past few months and was told several times that she was anemic but did not actually have a lab draw or fingerstick at that time.  Studies Reviewed:   EKG: EKG is ordered today and demonstrates:   EKG Interpretation Date/Time:  Tuesday August 05 2024 15:02:05 EST Ventricular Rate:  70 PR Interval:  158 QRS Duration:  84 QT Interval:  388 QTC Calculation: 419 R Axis:   16  Text Interpretation: Normal sinus rhythm Nonspecific ST abnormality - Improved from prior tracings. Confirmed by Johnson Laymon ([id number]) on 08/05/2024 3:05:39 PM       Echocardiogram: 05/2022 IMPRESSIONS     1. Left ventricular ejection fraction, by estimation, is 70 to 75%. The  left ventricle has hyperdynamic function. The left ventricle has no   regional wall motion abnormalities. Left ventricular diastolic parameters  were normal.   2. Right ventricular systolic function is normal. The right ventricular  size is normal. Tricuspid regurgitation signal is inadequate for assessing  PA pressure.   3. Left atrial size was mildly dilated.   4. The mitral valve is abnormal. Mild mitral valve regurgitation. No  evidence of mitral stenosis.   5. The tricuspid valve is abnormal.   6. The aortic valve is calcified. Aortic valve regurgitation is not  visualized. No aortic stenosis is present.   7. The inferior vena cava is normal in size with greater than 50%  respiratory variability, suggesting right atrial pressure of 3 mmHg.   8. Agitated saline contrast bubble study was negative, with no evidence  of any interatrial shunt.    Physical Exam:   VS:  BP 132/76 (BP Location: Right Arm, Cuff Size: Normal)   Pulse 70   Ht 5' 1 (1.549 m)   Wt 163 lb (73.9 kg)   SpO2 98%   BMI 30.80 kg/m    Wt Readings from Last 3 Encounters:  08/05/24 163 lb (73.9 kg)  05/29/23 168 lb (76.2 kg)  05/23/22 183 lb 6.4 oz (83.2 kg)     GEN: Well nourished, well developed female appearing in no acute distress NECK: No JVD; No carotid bruits CARDIAC: RRR, no murmurs, rubs, gallops RESPIRATORY:  Clear to auscultation without rales, wheezing or rhonchi  ABDOMEN: Appears non-distended. No  obvious abdominal masses. EXTREMITIES: No clubbing or cyanosis. No pitting edema.  Distal pedal pulses are 2+ bilaterally.   Assessment and Plan:   1. Palpitations - She reports her palpitations have overall been well-controlled. She only consumes 1 cup of coffee daily and otherwise limits caffeine intake. Continue Toprol -XL 50 mg daily.  2. Essential hypertension - Her blood pressure is at 132/76 during today's visit. Continue current medical therapy with Toprol -XL 50 mg daily and Telmisartan  20 mg daily. Will recheck a BMET   3. Anemia, unspecified type - She  was told during recent attempts to donate blood that she was anemic but did not have an actual lab draw. Will recheck a CBC with repeat labs.   Signed, Laymon CHRISTELLA Qua, PA-C   "

## 2024-08-05 ENCOUNTER — Encounter: Payer: Self-pay | Admitting: Student

## 2024-08-05 ENCOUNTER — Ambulatory Visit: Attending: Student | Admitting: Student

## 2024-08-05 VITALS — BP 132/76 | HR 70 | Ht 61.0 in | Wt 163.0 lb

## 2024-08-05 DIAGNOSIS — Z79899 Other long term (current) drug therapy: Secondary | ICD-10-CM | POA: Diagnosis not present

## 2024-08-05 DIAGNOSIS — R002 Palpitations: Secondary | ICD-10-CM

## 2024-08-05 DIAGNOSIS — D649 Anemia, unspecified: Secondary | ICD-10-CM | POA: Diagnosis not present

## 2024-08-05 DIAGNOSIS — I10 Essential (primary) hypertension: Secondary | ICD-10-CM | POA: Diagnosis not present

## 2024-08-05 MED ORDER — TELMISARTAN 20 MG PO TABS: 20.0000 mg | ORAL_TABLET | Freq: Every day | ORAL | 3 refills | Status: AC

## 2024-08-05 MED ORDER — METOPROLOL SUCCINATE ER 50 MG PO TB24: 50.0000 mg | ORAL_TABLET | Freq: Every day | ORAL | 3 refills | Status: AC

## 2024-08-05 NOTE — Patient Instructions (Addendum)
 Can try Melatonin for sleep.   Medication Instructions:  Your physician recommends that you continue on your current medications as directed. Please refer to the Current Medication list given to you today.  *If you need a refill on your cardiac medications before your next appointment, please call your pharmacy*  Lab Work: Your physician recommends that you return for lab work at [facility].   If you have labs (blood work) drawn today and your tests are completely normal, you will receive your results only by: MyChart Message (if you have MyChart) OR A paper copy in the mail If you have any lab test that is abnormal or we need to change your treatment, we will call you to review the results.  Testing/Procedures: NONE   Follow-Up: At [facility], you and your health needs are our priority.  As part of our continuing mission to provide you with exceptional heart care, our providers are all part of one team.  This team includes your primary Cardiologist (physician) and Advanced Practice Providers or APPs (Physician Assistants and Nurse Practitioners) who all work together to provide you with the care you need, when you need it.  Your next appointment:   1 year(s)  Provider:   You may see Jayson Sierras, MD or one of the following Advanced Practice Providers on your designated Care Team:   Laymon Qua, PA-C  [place], [place] Olivia [place], [place]     We recommend signing up for the patient portal called MyChart.  Sign up information is provided on this After Visit Summary.  MyChart is used to connect with patients for Virtual Visits (Telemedicine).  Patients are able to view lab/test results, encounter notes, upcoming appointments, etc.  Non-urgent messages can be sent to your provider as well.   To learn more about what you can do with MyChart, go to [email or web address].   Other Instructions Thank you for choosing [facility]!

## 2024-08-06 ENCOUNTER — Ambulatory Visit: Payer: Self-pay | Admitting: Student

## 2024-08-06 LAB — BASIC METABOLIC PANEL WITH GFR
BUN/Creatinine Ratio: 23 (ref 12–28)
BUN: 14 mg/dL (ref 8–27)
CO2: 21 mmol/L (ref 20–29)
Calcium: 9.5 mg/dL (ref 8.7–10.3)
Chloride: 101 mmol/L (ref 96–106)
Creatinine, Ser: 0.62 mg/dL (ref 0.57–1.00)
Glucose: 75 mg/dL (ref 70–99)
Potassium: 3.9 mmol/L (ref 3.5–5.2)
Sodium: 140 mmol/L (ref 134–144)
eGFR: 100 mL/min/1.73

## 2024-08-06 LAB — CBC
Hematocrit: 44.8 % (ref 34.0–46.6)
Hemoglobin: 15 g/dL (ref 11.1–15.9)
MCH: 30.2 pg (ref 26.6–33.0)
MCHC: 33.5 g/dL (ref 31.5–35.7)
MCV: 90 fL (ref 79–97)
Platelets: 336 x10E3/uL (ref 150–450)
RBC: 4.96 x10E6/uL (ref 3.77–5.28)
RDW: 13.8 % (ref 11.7–15.4)
WBC: 10.7 x10E3/uL (ref 3.4–10.8)
